# Patient Record
Sex: Male | Born: 1965 | Race: Black or African American | Hispanic: No | Marital: Married | Smoking: Never smoker
Health system: Southern US, Community
[De-identification: ages and names within clinical notes are randomized; demographics above are authoritative.]

## PROBLEM LIST (undated history)

## (undated) DIAGNOSIS — M1990 Unspecified osteoarthritis, unspecified site: Secondary | ICD-10-CM

## (undated) DIAGNOSIS — G4730 Sleep apnea, unspecified: Secondary | ICD-10-CM

## (undated) DIAGNOSIS — I10 Essential (primary) hypertension: Secondary | ICD-10-CM

## (undated) HISTORY — PX: TONSILLECTOMY: SUR1361

## (undated) HISTORY — PX: APPENDECTOMY: SHX54

## (undated) HISTORY — PX: HERNIA REPAIR: SHX51

## (undated) HISTORY — PX: CARPAL TUNNEL RELEASE: SHX101

---

## 2007-05-02 ENCOUNTER — Other Ambulatory Visit: Payer: Self-pay

## 2007-05-03 ENCOUNTER — Inpatient Hospital Stay: Payer: Self-pay | Admitting: Internal Medicine

## 2008-06-14 ENCOUNTER — Emergency Department: Payer: Self-pay | Admitting: Internal Medicine

## 2010-11-29 ENCOUNTER — Ambulatory Visit: Payer: Self-pay | Admitting: Internal Medicine

## 2011-02-06 ENCOUNTER — Ambulatory Visit: Payer: Self-pay | Admitting: Anesthesiology

## 2011-02-06 DIAGNOSIS — I10 Essential (primary) hypertension: Secondary | ICD-10-CM

## 2011-02-09 ENCOUNTER — Ambulatory Visit: Payer: Self-pay | Admitting: Unknown Physician Specialty

## 2011-11-07 ENCOUNTER — Ambulatory Visit: Payer: Self-pay | Admitting: Surgery

## 2011-11-07 LAB — CBC
HCT: 48.3 % (ref 40.0–52.0)
HGB: 16.2 g/dL (ref 13.0–18.0)
MCH: 26.9 pg (ref 26.0–34.0)
MCV: 80 fL (ref 80–100)
Platelet: 226 10*3/uL (ref 150–440)
RDW: 14.2 % (ref 11.5–14.5)
WBC: 9.7 10*3/uL (ref 3.8–10.6)

## 2011-11-07 LAB — URINALYSIS, COMPLETE
Bacteria: NONE SEEN
Bilirubin,UR: NEGATIVE
Blood: NEGATIVE
Glucose,UR: NEGATIVE mg/dL (ref 0–75)
Ketone: NEGATIVE
Ph: 5 (ref 4.5–8.0)
Protein: NEGATIVE
RBC,UR: NONE SEEN /HPF (ref 0–5)
Squamous Epithelial: NONE SEEN

## 2011-11-07 LAB — COMPREHENSIVE METABOLIC PANEL
Albumin: 3.9 g/dL (ref 3.4–5.0)
Anion Gap: 6 — ABNORMAL LOW (ref 7–16)
BUN: 11 mg/dL (ref 7–18)
Bilirubin,Total: 0.9 mg/dL (ref 0.2–1.0)
Calcium, Total: 9.7 mg/dL (ref 8.5–10.1)
Chloride: 100 mmol/L (ref 98–107)
Glucose: 112 mg/dL — ABNORMAL HIGH (ref 65–99)
Osmolality: 274 (ref 275–301)
Potassium: 4 mmol/L (ref 3.5–5.1)
SGOT(AST): 28 U/L (ref 15–37)
Sodium: 137 mmol/L (ref 136–145)
Total Protein: 8.3 g/dL — ABNORMAL HIGH (ref 6.4–8.2)

## 2011-11-14 LAB — PATHOLOGY REPORT

## 2012-10-10 ENCOUNTER — Ambulatory Visit: Payer: Self-pay | Admitting: Surgery

## 2013-09-22 IMAGING — CT CT ABD-PELV W/ CM
1 of 2 series · 15 of 32 positions shown, 19 images · non-contrast
Comparison: none

REASON FOR EXAM: (1) rlq pain; (2) rlq pain
COMMENTS:

[Series 2: soft tissue · axial · 0.87mm/px · z∈[-855,-378]mm · 15 of 173 slices shown, 19 images]
[im 7/173  soft-tissue]
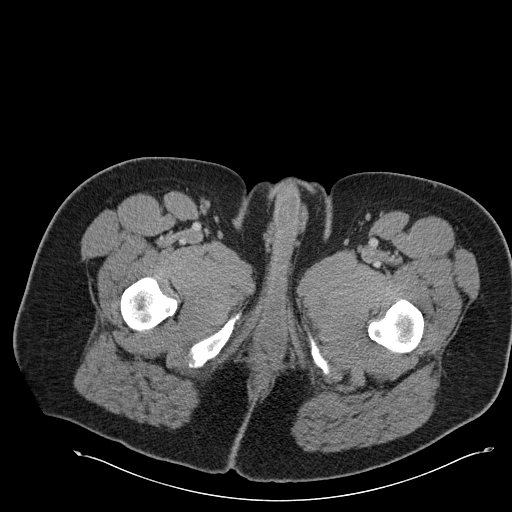
[im 7/173  bone]
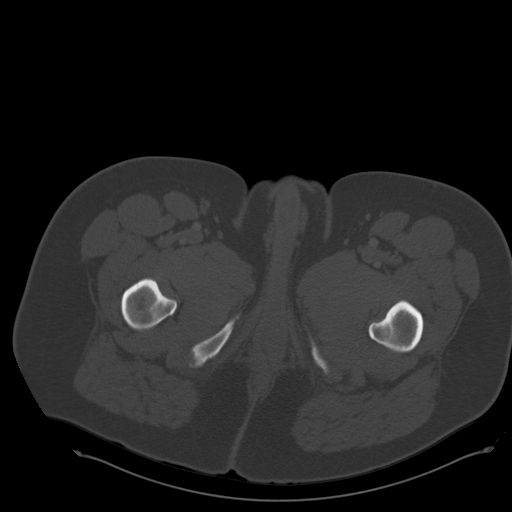
[im 21/173  soft-tissue]
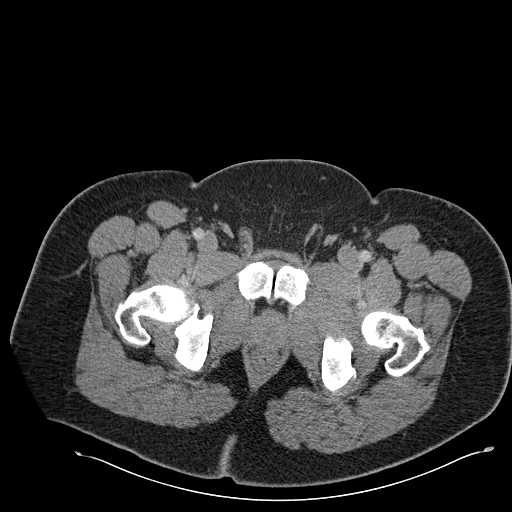
[im 35/173  soft-tissue]
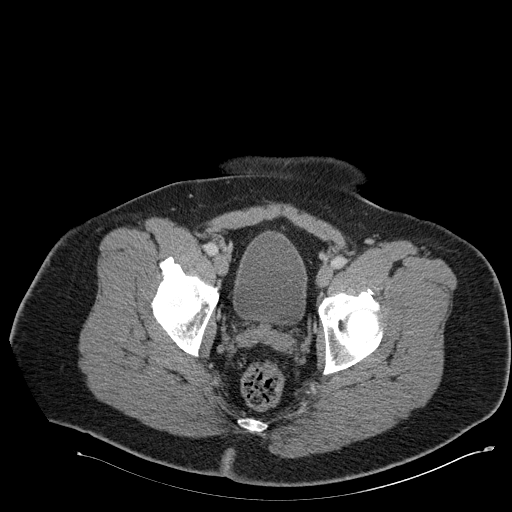
[im 49/173  soft-tissue]
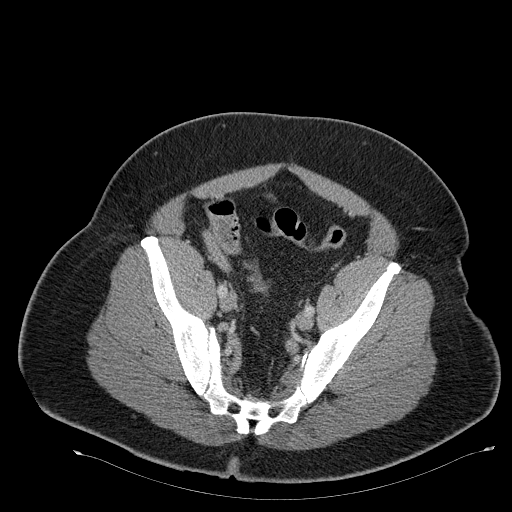
[im 62/173  soft-tissue]
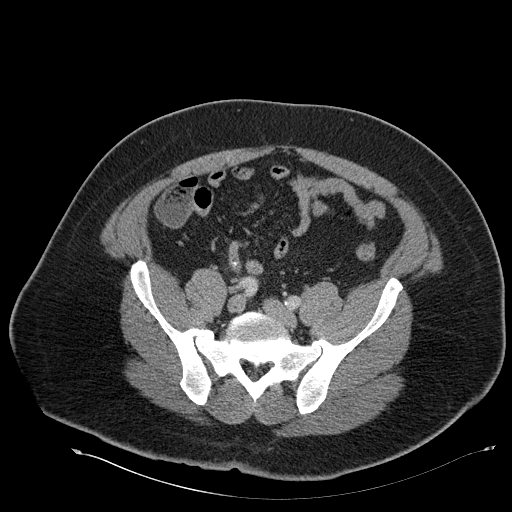
[im 76/173  soft-tissue]
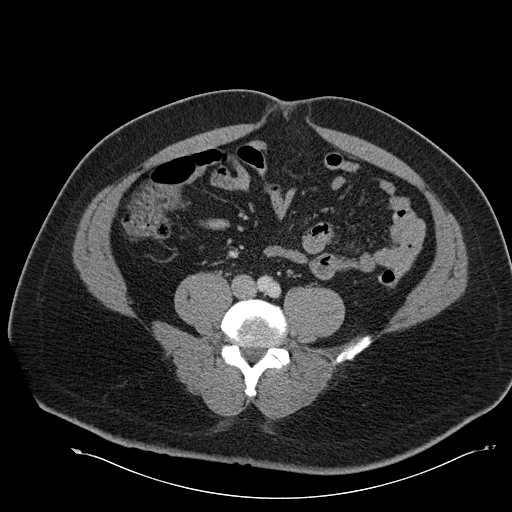
[im 90/173  soft-tissue]
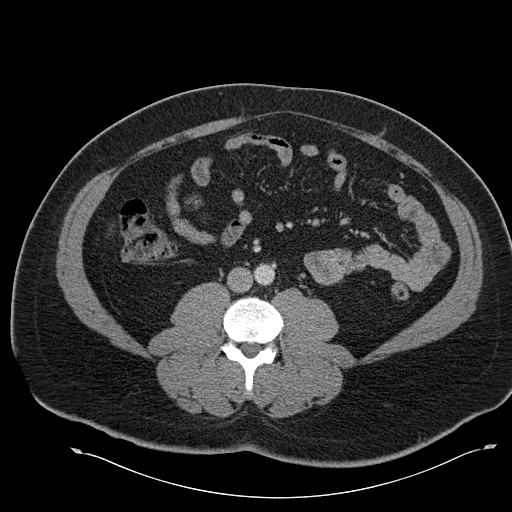
[im 97/173  soft-tissue]
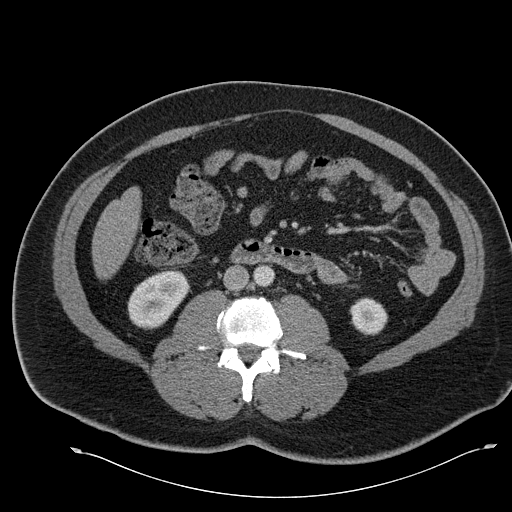
[im 111/173  soft-tissue]
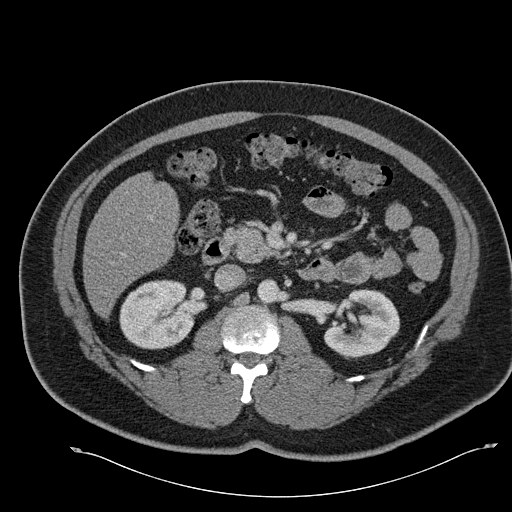
[im 111/173  bone]
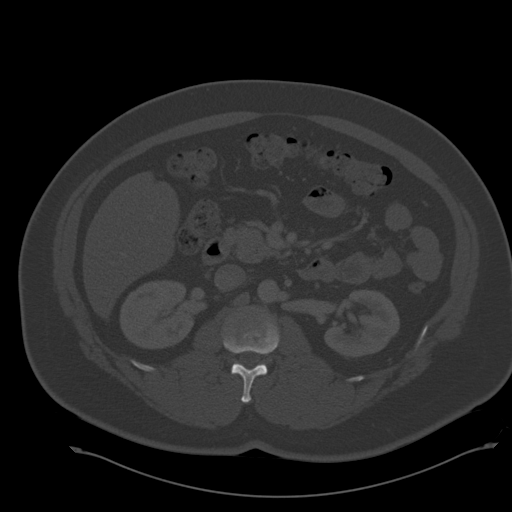
[im 124/173  soft-tissue]
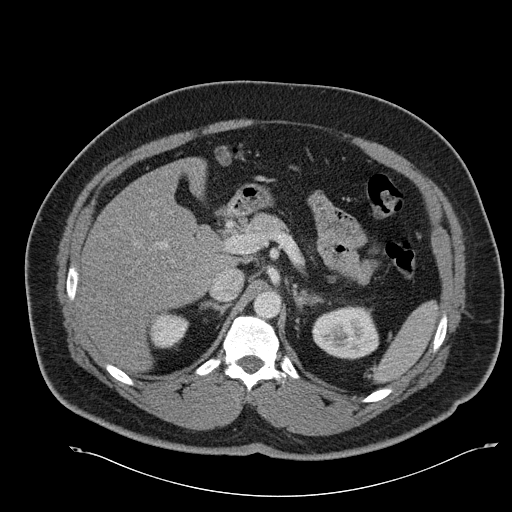
[im 138/173  soft-tissue]
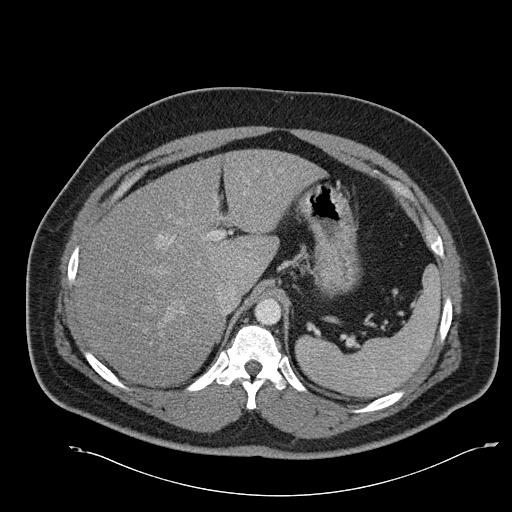
[im 145/173  lung]
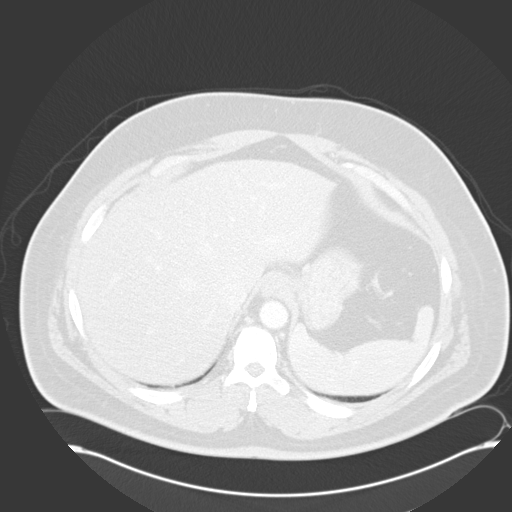
[im 152/173  soft-tissue]
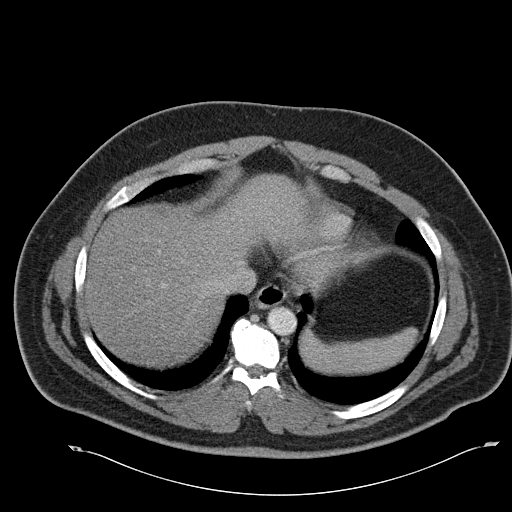
[im 152/173  lung]
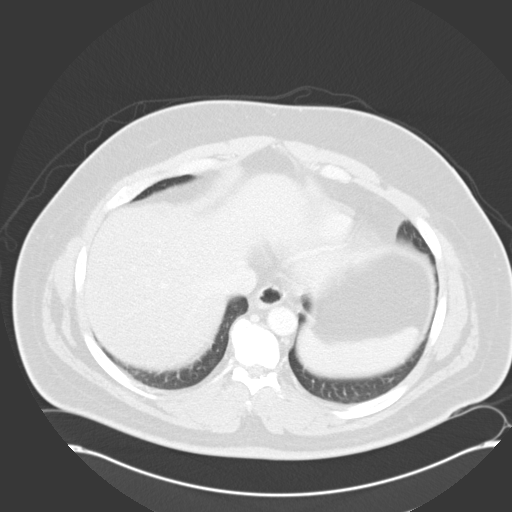
[im 159/173  lung]
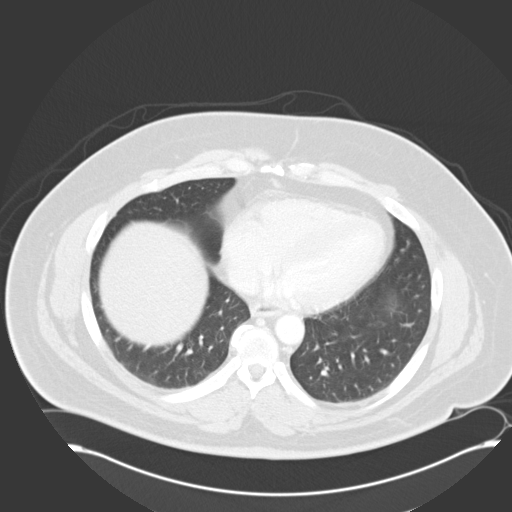
[im 166/173  soft-tissue]
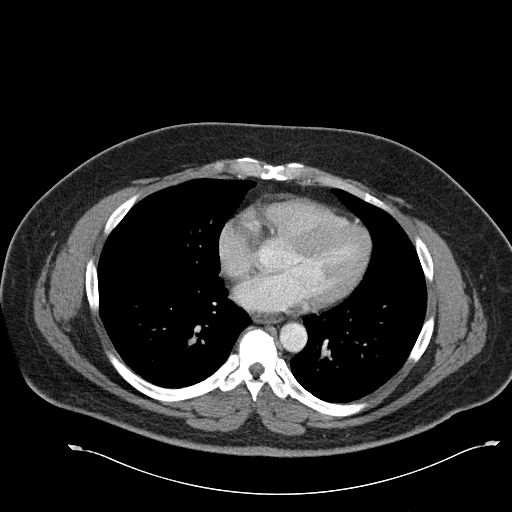
[im 166/173  lung]
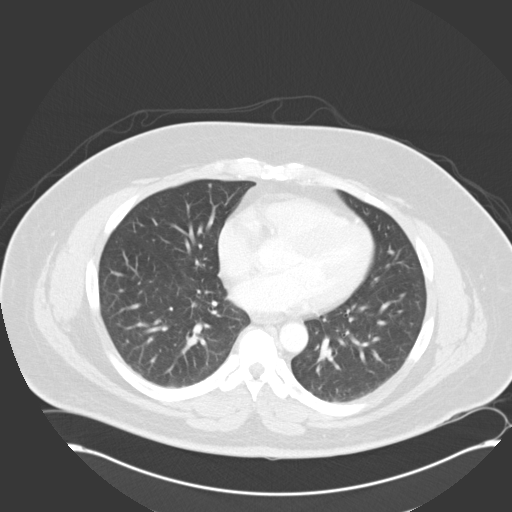

[15 of 32 positions shown; findings below may reference images not displayed]

PROCEDURE:     CT  - CT ABDOMEN / PELVIS  W  - November 07, 2011 [DATE]

RESULT:     CT of the abdomen and pelvis is performed with 100 mL of
6sovue-E2K iodinated intravenous contrast. No oral contrast was administered.

The appendix is identified and has diameter of 10.9 mm on image 117. There
is mild enhancement of the wall with irregular margins suggestive of mild
inflammation. Calcific densities seen at the base of the appendix at the
origin from the cecum on image 117 with an additional small area of
calcification on image 112 more towards the distal portion. There is no
evidence of perforation or abscess formation. Minimal sigmoid colon
diverticulosis is present very there is no evidence of acute diverticulitis.
There is no evidence of abnormal bowel distention otherwise. Fatty
infiltration is seen in the liver. Cholecystectomy clips are present. There
is a nonobstructing left renal stone on image 60 and image 59 measuring up
to 4 mm diameter. Tiny lower pole left renal stone of 1 to 2 mm is seen on
image 69. No additional calculi are seen. The adrenal glands appear
unremarkable on the right it showed minimal nodular density on the left on
image 41 there appears to be low-attenuation measuring 1.6 cm in diameter.
The liver shows no mass. The spleen is unremarkable. The pancreas is within
normal limits. No adenopathy is evident. The urinary bladder appears
unremarkable. The lung bases appear clear.
IMPRESSION: Mild inflammation consistent with acute appendicitis.
Appendicoliths appear present. Surgical consultation is recommended. There
is no evidence of perforation or abscess formation.
Left nephrolithiasis.

[REDACTED]

## 2014-06-30 NOTE — Op Note (Signed)
PATIENT NAME:  Lance, Willis MR#:  [id number] DATE OF BIRTH:  07/16/1965  DATE OF PROCEDURE:  11/07/2011  PREOPERATIVE DIAGNOSIS: Acute appendicitis.   POSTOPERATIVE DIAGNOSIS: Acute appendicitis.  PROCEDURE:  Laparoscopic appendectomy.   SURGEON:  Junius Creamer, MD  ANESTHESIA: General.   SPECIMEN: Appendix.   ESTIMATED BLOOD LOSS:  30 mL.  INDICATION FOR SURGERY: [name] is a 49 year old male who presents with 12 hours of right lower quadrant and suprapubic pain with loss of appetite.  He had a CT scan concerning for acute appendicitis. He is brought to the operating room for laparoscopic appendectomy.   DETAILS OF THE PROCEDURE:  The patient was brought to the operating room suite. He was laid supine on the operating room table. He was induced, endotracheal tube was placed, and general anesthesia was administered. The abdomen was then prepped and draped in standard surgical fashion. Time-out was then performed, correctly identifying patient name, operative site, and procedure to be performed. An infraumbilical incision was made. This was deepened into the fascia. The fascia was grasped. The fascia was incised.  I was able to place a finger but felt adhesions to the underside of the peritoneum. I decided to place a [facility] trocar. It was secured with 2-0 Vicryl transfascial stay sutures. The abdomen was insufflated. There was a large amount of adhesions superiorly at the patient's previous gallbladder incision and what looked like a hernia on CT scan. I put a 5-mm port in the right upper quadrant and then a left lower quadrant 5-mm port. I then proceeded to pull down these adhesions and examined my entry site and did not see any bowel injury. There was kind of a Swiss cheese appearing defect in the fascia superior the umbilical port. I then placed a left lower quadrant 5- mm trocar. I grasped the appendix. It was definitely acute appendicitis. I made a hole in the mesoappendix at the base of the  appendix. I placed a laparoscopic stapler across the base with a blue load and ligated the base of the appendix. The mesoappendix was extremely thick and therefore I placed two red load staples across the mesoappendix. The appendix was then taken with an Endo Catch bag. The staple line was examined and found to be hemostatic. The trocars were then taken out. The fascia was then closed with a figure-of-eight 0 Vicryl at the umbilical site. 4-0 deep dermal Monocryl was then used to close the skin. Steri-Strips were then used to close the incision. The patient was then awakened and taken to the postanesthesia care unit. There were no immediate complications. Needle, sponge, and instrument counts were correct at the end of the procedure.   ____________________________ Glena Norfolk. Ikea Demicco, MD cal:bjt D: 11/07/2011 17:43:05 ET T: 11/08/2011 10:08:08 ET JOB#: [id number]  cc: Harrell Gave A. December Hedtke, MD, <Dictator> Floyde Parkins MD ELECTRONICALLY SIGNED 11/10/2011 20:00

## 2014-06-30 NOTE — H&P (Signed)
Subjective/Chief Complaint Suprapubic and RLQ pain, loss of appetite    History of Present Illness Lance Willis is a pleasant 49 yo male who presents with approx 12 hours of suprapubic pain, loss of appetite and periappendiceal stranding on CT scan.  He says that he woke up this am approx 1 am with suprapubic pain worse on RLQ.  Says that he tried to go to work at the [employer] but pain made him come to the emergency room.  He says that his pain has gotten worse.  Has not eaten anything since yesterday due to decreased appetite.  Says he may be able to eat now but unsure.  No nausea/vomiting.  Pain medicine makes it better.  No fevers, chills, night sweats, shortness of breath, cough, diarrhea/constipation (has 1 bm per day).  No sick contacts.  No unusual ingestions.    Past History H/o cholecystectomy HTN OSA History of hand surgery    Past Medical Health Diabetes Mellitus   Past Med/Surgical Hx:  Bronchitis:   Sleep Apnea: uses cpap  chronic neutropenia:   Obesity:   high blood pressure.:   Tonsillectomy and Adenoidectomy:   Cholecystectomy: laparoscopic with umbilical hernia repair  ALLERGIES:  No Known Allergies:   Family and Social History:   Family History Non-Contributory    Social History negative tobacco, positive ETOH, negative Illicit drugs   Review of Systems:   Subjective/Chief Complaint RLQ, suprapubic pain    Fever/Chills No    Cough No    Sputum No    Abdominal Pain Yes    Diarrhea No    Constipation No    Nausea/Vomiting No    SOB/DOE No    Chest Pain No    Dysuria No    Tolerating Diet Yes    Medications/Allergies Reviewed Medications/Allergies reviewed   Physical Exam:   GEN well developed, well nourished, no acute distress, obese    HEENT pink conjunctivae, moist oral mucosa    NECK No masses    RESP normal resp effort  clear BS  no use of accessory muscles    CARD regular rate  no murmur    ABD positive tenderness  positive  hernia  normal BS    LYMPH negative neck, negative axillae    EXTR negative cyanosis/clubbing, negative edema    SKIN normal to palpation, No rashes, No ulcers    NEURO cranial nerves intact    PSYCH A+O to time, place, person   Radiology Results: CT:    27-Aug-13 11:13, CT Abdomen and Pelvis With Contrast   CT Abdomen and Pelvis With Contrast   REASON FOR EXAM:    (1) rlq pain; (2) rlq pain  COMMENTS:       PROCEDURE: CT  - CT ABDOMEN / PELVIS  W  - Nov 07 2011 11:13AM     RESULT: CT of the abdomen and pelvis is performed with 100 mL of   Isovue-370 iodinated intravenous contrast. No oral contrast was   administered.    The appendix is identified and has diameter of 10.9 mm on image 117.   There is mild enhancement of the wall with irregular margins suggestive   of mild inflammation. Calcific densities seen at the base of the appendix   at the origin from the cecum on image 117 with an additional small area   of calcification on image 112 more towards the distal portion. There is   no evidence of perforation or abscess formation. Minimal sigmoid colon  diverticulosis is present very there is no evidence of acute   diverticulitis. There is no evidence of abnormal bowel distention   otherwise. Fatty infiltration is seen in the liver. Cholecystectomy clips   are present. There is a nonobstructing left renal stone on image 60 and  image 59 measuring up to 4 mm diameter. Tiny lower pole left renal stone   of 1 to 2 mm is seen on image 69. No additional calculi are seen. The   adrenal glands appear unremarkable on the right it showed minimal nodular   density on the left on image 41 there appears to be low-attenuation   measuring 1.6 cm in diameter. The liver shows no mass. The spleen is   unremarkable. The pancreas is within normal limits. No adenopathy is   evident. The urinary bladder appears unremarkable. The lung bases appear   clear.    IMPRESSION:  Mild inflammation  consistent with acute appendicitis.   Appendicoliths appear present. Surgical consultation is recommended.     There is no evidence of perforation or abscess formation.  Left nephrolithiasis.    Dictation Site: 1          Verified By: Sundra Aland, M.D., MD     Assessment/Admission Diagnosis Lance Willis is a pleasant 49 yo M with history and CT findings consistent with acute appendicitis.  Plan for OR for laparoscopic appendectomy.  May consider VHR without mesh if able to do so without difficulty, otherwise may do electively with mesh in the future.   Electronic Signatures: Floyde Parkins (MD)  (Signed 27-Aug-13 14:24)  Authored: CHIEF COMPLAINT and HISTORY, PAST MEDICAL/SURGIAL HISTORY, ALLERGIES, FAMILY AND SOCIAL HISTORY, REVIEW OF SYSTEMS, PHYSICAL EXAM, Radiology, ASSESSMENT AND PLAN   Last Updated: 27-Aug-13 14:24 by Floyde Parkins (MD)

## 2014-07-03 ENCOUNTER — Ambulatory Visit
Admit: 2014-07-03 | Disposition: A | Discharge status: Home / Self Care | Payer: Self-pay | Attending: Unknown Physician Specialty | Admitting: Unknown Physician Specialty

## 2014-07-03 NOTE — Op Note (Signed)
PATIENT NAME:  Lance Willis, Lance Willis MR#:  [id number] DATE OF BIRTH:  Mar 28, 1965  DATE OF PROCEDURE:  10/10/2012  PREOPERATIVE DIAGNOSIS: Ventral hernia.   POSTOPERATIVE DIAGNOSIS: Ventral hernia.   PROCEDURE: Ventral hernia repair.   SURGEON: Rochel Brome.   ANESTHESIA: General.   INDICATIONS: This 49 year old male has a history of bulging in the epigastrium which is just approximately 3 cm above the navel and slightly oriented to the left of the midline. Ventral hernia was demonstrated on physical exam and repair was recommended for definitive treatment.   DESCRIPTION OF PROCEDURE: The patient was placed on the operating table in the supine position under general endotracheal anesthesia. The abdomen was clipped and prepared with ChloraPrep and draped in a sterile manner.   A transversely-oriented incision was made approximately 3 cm cephalad to the umbilicus, oriented slightly to the left of the midline approximately 4 cm in length, carried down through subcutaneous tissues to encounter a ventral hernia sac. This sac was approximately 3 cm in length and was dissected free from surrounding structures up into the fascial ring defect where it was separated from the fascial ring defect, and the sac was amputated. There was underlying omentum. No intestine was seen. The properitoneum was dissected so that the fatty tissue was dissected away from the fascia circumferentially underneath the fascial ring defect. The defect itself was approximately 12 mm in dimension. An Atrium mesh was selected and cut to create an oval shape of some 1 x 1.5 cm. This was placed into the properitoneal plane and was sutured to the fascia with 0 Surgilon through-and-through suture, placed inferiorly and superiorly.   Next, the repair was carried out with a transversely-oriented row of interrupted 0 Surgilon figure-of-eight sutures, incorporating each suture into the mesh. The repair looked good. It was noted that during the course  of the procedure a number of small bleeding points were cauterized. Hemostasis was subsequently intact. Subcutaneous tissues were approximated with interrupted 4-0 Monocryl. The skin was closed with running 4-0 Monocryl subcuticular suture and Dermabond.   The patient tolerated surgery satisfactorily and was then prepared for transfer to the recovery room.    ____________________________ Lenna Sciara. Rochel Brome, MD jws:dm D: 10/10/2012 08:36:41 ET T: 10/10/2012 09:09:13 ET JOB#: [id number]  cc: Loreli Dollar, MD, <Dictator> Loreli Dollar MD ELECTRONICALLY SIGNED 10/13/2012 17:26

## 2015-03-21 ENCOUNTER — Emergency Department
Admission: EM | Admit: 2015-03-21 | Discharge: 2015-03-21 | Disposition: A | Discharge status: Home / Self Care | Payer: Federal, State, Local not specified - PPO | Attending: Emergency Medicine | Admitting: Emergency Medicine

## 2015-03-21 ENCOUNTER — Emergency Department: Payer: Federal, State, Local not specified - PPO

## 2015-03-21 ENCOUNTER — Encounter: Payer: Self-pay | Admitting: Emergency Medicine

## 2015-03-21 DIAGNOSIS — I10 Essential (primary) hypertension: Secondary | ICD-10-CM | POA: Insufficient documentation

## 2015-03-21 DIAGNOSIS — J40 Bronchitis, not specified as acute or chronic: Secondary | ICD-10-CM

## 2015-03-21 DIAGNOSIS — H9202 Otalgia, left ear: Secondary | ICD-10-CM | POA: Diagnosis not present

## 2015-03-21 DIAGNOSIS — J209 Acute bronchitis, unspecified: Secondary | ICD-10-CM | POA: Diagnosis not present

## 2015-03-21 DIAGNOSIS — J069 Acute upper respiratory infection, unspecified: Secondary | ICD-10-CM

## 2015-03-21 DIAGNOSIS — R05 Cough: Secondary | ICD-10-CM | POA: Diagnosis present

## 2015-03-21 HISTORY — DX: Essential (primary) hypertension: I10

## 2015-03-21 MED ORDER — PROMETHAZINE HCL 25 MG PO TABS: 25.0000 mg | ORAL_TABLET | Freq: Four times a day (QID) | ORAL | Status: DC | PRN

## 2015-03-21 MED ORDER — PSEUDOEPHEDRINE HCL 60 MG PO TABS: 60.0000 mg | ORAL_TABLET | ORAL | Status: DC | PRN

## 2015-03-21 MED ORDER — AZITHROMYCIN 250 MG PO TABS: ORAL_TABLET | ORAL | Status: DC

## 2015-03-21 MED ORDER — HYDROCODONE-ACETAMINOPHEN 5-325 MG PO TABS: 1.0000 | ORAL_TABLET | ORAL | Status: DC | PRN

## 2015-03-21 MED ORDER — GUAIFENESIN-CODEINE 100-10 MG/5ML PO SOLN: 10.0000 mL | ORAL | Status: DC | PRN

## 2015-03-21 NOTE — ED Provider Notes (Signed)
[facility] Emergency Department Provider Note  ____________________________________________  Time seen: Approximately 1:15 PM  I have reviewed the triage vital signs and the nursing notes.   HISTORY  Chief Complaint Cough    HPI Lance Willis is a 50 y.o. male presents with a one-week history of cough congestion and ear pressure. Patient states that his cough is productive green sputum. Eyes any fever chills at this time.   Past Medical History  Diagnosis Date  . Hypertension     There are no active problems to display for this patient.   History reviewed. No pertinent past surgical history.  Current Outpatient Rx  Name  Route  Sig  Dispense  Refill  . azithromycin (ZITHROMAX Z-PAK) 250 MG tablet      Take 2 tablets (500 mg) on  Day 1,  followed by 1 tablet (250 mg) once daily on Days 2 through 5.   6 each   0   . guaiFENesin-codeine 100-10 MG/5ML syrup   Oral   Take 10 mLs by mouth every 4 (four) hours as needed for cough.   180 mL   0   . pseudoephedrine (SUDAFED) 60 MG tablet   Oral   Take 1 tablet (60 mg total) by mouth every 4 (four) hours as needed for congestion.   24 tablet   0     Allergies Review of patient's allergies indicates no known allergies.  History reviewed. No pertinent family history.  Social History Social History  Substance Use Topics  . Smoking status: Never Smoker   . Smokeless tobacco: None  . Alcohol Use: No    Review of Systems Constitutional: No fever/chills Eyes: No visual changes. ENT: Positive sore throat, left ear pain. Cardiovascular: Denies chest pain. Respiratory: Denies shortness of breath. Positive for cough. Gastrointestinal: No abdominal pain.  No nausea, no vomiting.  No diarrhea.  No constipation. Genitourinary: Negative for dysuria. Musculoskeletal: Negative for back pain. Skin: Negative for rash. Neurological: Negative for headaches, focal weakness or  numbness.  10-point ROS otherwise negative.  ____________________________________________   PHYSICAL EXAM:  VITAL SIGNS: ED Triage Vitals  Enc Vitals Group     BP 03/21/15 1235 135/87 mmHg     Pulse Rate 03/21/15 1235 115     Resp 03/21/15 1235 18     Temp 03/21/15 1235 97.7 F (36.5 C)     Temp src --      SpO2 03/21/15 1235 96 %     Weight 03/21/15 1235 280 lb (127.007 kg)     Height 03/21/15 1235 5\' 9"  (1.753 m)     Head Cir --      Peak Flow --      Pain Score 03/21/15 1236 3     Pain Loc --      Pain Edu? --      Excl. in [place]? --     Constitutional: Alert and oriented. Well appearing and in no acute distress. Eyes: Conjunctivae are normal. PERRL. EOMI. Head: Atraumatic. Left TM erythematous. Nose: No congestion/rhinnorhea. Mouth/Throat: Mucous membranes are moist.  Oropharynx non-erythematous. Neck: No stridor.   Cardiovascular: Normal rate, regular rhythm. Grossly normal heart sounds.  Good peripheral circulation. Respiratory: Normal respiratory effort.  No retractions. Lungs CTAB. Musculoskeletal: No lower extremity tenderness nor edema.  No joint effusions. Neurologic:  Normal speech and language. No gross focal neurologic deficits are appreciated. No gait instability. Skin:  Skin is warm, dry and intact. No rash noted. Psychiatric: Mood and affect  are normal. Speech and behavior are normal.  ____________________________________________   LABS (all labs ordered are listed, but only abnormal results are displayed)  Labs Reviewed - No data to display ____________________________________________    RADIOLOGY  Negative for pneumonia or acute atelectasis. No acute pulmonary findings ____________________________________________   PROCEDURES  Procedure(s) performed: None  Critical Care performed: No  ____________________________________________   INITIAL IMPRESSION / ASSESSMENT AND PLAN / ED COURSE  Pertinent labs & imaging results that were  available during my care of the patient were reviewed by me and considered in my medical decision making (see chart for details).  Acute URI/bronchitis. Rx given for Zithromax, Robitussin-AC, and Sudafed. Patient follow-up PCP or return to the ER with any worsening symptomology. Patient voices no other emergency medical complaints at this visit. ____________________________________________   FINAL CLINICAL IMPRESSION(S) / ED DIAGNOSES  Final diagnoses:  URI, acute  Bronchitis      Arlyss Repress, PA-C 03/21/15 1439  Eula Listen, MD 03/21/15 [phone]

## 2015-03-21 NOTE — Discharge Instructions (Signed)

## 2015-03-21 NOTE — ED Notes (Signed)
AAOx3.  Skin warm and dry.  NAD 

## 2015-03-21 NOTE — ED Notes (Signed)
Cough, congestion, ear pressure, nyquil at 430

## 2016-01-21 ENCOUNTER — Encounter (INDEPENDENT_AMBULATORY_CARE_PROVIDER_SITE_OTHER): Payer: Self-pay | Admitting: Orthopedic Surgery

## 2016-01-21 ENCOUNTER — Ambulatory Visit (INDEPENDENT_AMBULATORY_CARE_PROVIDER_SITE_OTHER): Payer: Federal, State, Local not specified - PPO | Admitting: Orthopedic Surgery

## 2016-01-21 VITALS — Ht 69.0 in | Wt 285.0 lb

## 2016-01-21 DIAGNOSIS — M65321 Trigger finger, right index finger: Secondary | ICD-10-CM | POA: Diagnosis not present

## 2016-01-21 DIAGNOSIS — M65331 Trigger finger, right middle finger: Secondary | ICD-10-CM | POA: Diagnosis not present

## 2016-01-21 NOTE — Progress Notes (Signed)
   Office Visit Note   Patient: Lance Willis           Date of Birth: [birth year]           MRN: [id number] Visit Date: 01/21/2016              Requested by: Madelyn Brunner, MD [facility] [place], [place] [address] PCP: Madelyn Brunner, MD   Assessment & Plan: Visit Diagnoses:  1. Trigger middle finger of right hand   2. Trigger finger, right index finger     Plan: Due to pain with activities of daily living and triggering worse in the morning and triggering at work patient elected to proceed with surgical intervention. Patient states that this was a work-related injury and we will request authorization to be filed through [employer].  Follow-Up Instructions: Return if symptoms worsen or fail to improve.   Orders:  No orders of the defined types were placed in this encounter.  No orders of the defined types were placed in this encounter.     Procedures: No procedures performed   Clinical Data: No additional findings.   Subjective: Chief Complaint  Patient presents with  . Right Hand - Numbness, Pain    Patient presents in office today for right hand pain. He has triggering of the right hand index and long finger.  He has had EMG/NCV studies a while back and was advised that he did not need carpal tunnel surgery but could possible need trigger finger release of A1 pulley. He complains of numbness right hand. He is right hand dominant. He has swelling in right hand and is taking ibuprofen every other day for pain. He has had prior relief with prednisone taper. He has brought duty status report paperwork to have filled out today at visit.     Review of Systems   Objective: Vital Signs: Ht 5\' 9"  (1.753 m)   Wt 285 lb (129.3 kg)   BMI 42.09 kg/m   Physical Exam Patient is alert oriented no adenopathy well-dressed normal affect normal respiratory effort normal gait. Examination patient has swelling and subjective numbness  in the right fingers. Patient's nerve conduction studies performed in December 2013 showed normal nerve conduction studies. Patient has palpable triggering at the A1 pulley of the index and long finger. Ortho Exam  Specialty Comments:  No specialty comments available.  Imaging: No results found.   PMFS History: There are no active problems to display for this patient.  Past Medical History:  Diagnosis Date  . Hypertension     History reviewed. No pertinent family history.  History reviewed. No pertinent surgical history. Social History   Occupational History  . Not on file.   Social History Main Topics  . Smoking status: Never Smoker  . Smokeless tobacco: Never Used  . Alcohol use No  . Drug use: Unknown  . Sexual activity: Not on file

## 2016-03-10 ENCOUNTER — Telehealth (INDEPENDENT_AMBULATORY_CARE_PROVIDER_SITE_OTHER): Payer: Self-pay | Admitting: Orthopedic Surgery

## 2016-03-10 NOTE — Telephone Encounter (Signed)
Lance Willis called in to discuss how long he would possible be out of work after his trigger finger release. He works for the [employer].  I discussed his case with Dondra Prader, NP and she advised to plan for out of work until patient's first post-operative appt.  I advised Lance Willis.

## 2016-07-12 ENCOUNTER — Other Ambulatory Visit: Payer: Self-pay | Admitting: Orthopedic Surgery

## 2016-07-12 DIAGNOSIS — M25562 Pain in left knee: Secondary | ICD-10-CM

## 2016-07-26 ENCOUNTER — Ambulatory Visit
Admission: RE | Admit: 2016-07-26 | Discharge: 2016-07-26 | Disposition: A | Discharge status: Home / Self Care | Payer: Federal, State, Local not specified - PPO | Source: Ambulatory Visit | Attending: Orthopedic Surgery | Admitting: Orthopedic Surgery

## 2016-07-26 DIAGNOSIS — M25562 Pain in left knee: Secondary | ICD-10-CM | POA: Diagnosis present

## 2016-07-26 DIAGNOSIS — X58XXXA Exposure to other specified factors, initial encounter: Secondary | ICD-10-CM | POA: Insufficient documentation

## 2016-07-26 DIAGNOSIS — M25462 Effusion, left knee: Secondary | ICD-10-CM | POA: Diagnosis not present

## 2016-07-26 DIAGNOSIS — S83242A Other tear of medial meniscus, current injury, left knee, initial encounter: Secondary | ICD-10-CM | POA: Insufficient documentation

## 2017-04-09 ENCOUNTER — Other Ambulatory Visit: Payer: Self-pay | Admitting: Orthopedic Surgery

## 2017-04-09 DIAGNOSIS — M25561 Pain in right knee: Secondary | ICD-10-CM

## 2017-04-24 ENCOUNTER — Ambulatory Visit
Admission: RE | Admit: 2017-04-24 | Discharge: 2017-04-24 | Disposition: A | Discharge status: Home / Self Care | Payer: Federal, State, Local not specified - PPO | Source: Ambulatory Visit | Attending: Orthopedic Surgery | Admitting: Orthopedic Surgery

## 2017-04-24 DIAGNOSIS — M23221 Derangement of posterior horn of medial meniscus due to old tear or injury, right knee: Secondary | ICD-10-CM | POA: Insufficient documentation

## 2017-04-24 DIAGNOSIS — M948X8 Other specified disorders of cartilage, other site: Secondary | ICD-10-CM | POA: Insufficient documentation

## 2017-04-24 DIAGNOSIS — M25561 Pain in right knee: Secondary | ICD-10-CM | POA: Diagnosis present

## 2017-06-21 ENCOUNTER — Encounter
Admission: RE | Admit: 2017-06-21 | Discharge: 2017-06-21 | Disposition: A | Discharge status: Home / Self Care | Payer: Federal, State, Local not specified - PPO | Source: Ambulatory Visit | Attending: Orthopedic Surgery | Admitting: Orthopedic Surgery

## 2017-06-21 ENCOUNTER — Other Ambulatory Visit: Payer: Self-pay

## 2017-06-21 DIAGNOSIS — I10 Essential (primary) hypertension: Secondary | ICD-10-CM | POA: Diagnosis not present

## 2017-06-21 DIAGNOSIS — Z01810 Encounter for preprocedural cardiovascular examination: Secondary | ICD-10-CM | POA: Insufficient documentation

## 2017-06-21 HISTORY — DX: Unspecified osteoarthritis, unspecified site: M19.90

## 2017-06-21 NOTE — Pre-Procedure Instructions (Signed)
AS INSTRUCTED BY DR Donata Duff, EKG FAXED WITH REQUEST FOR CLEARANCE TO DR Mable Fill. SPOKE WITH TRACY. ALSO FAXED FYI TO DR [facility]

## 2017-06-21 NOTE — Patient Instructions (Signed)
Your procedure is scheduled on: 06/28/17 Thurs Report to Same Day Surgery 2nd floor medical mall [facility] Entrance-take elevator on left to 2nd floor.  Check in with surgery information desk.) To find out your arrival time please call [phone] between 1PM - 3PM on 06/27/17 Wed  Remember: Instructions that are not followed completely may result in serious medical risk, up to and including death, or upon the discretion of your surgeon and anesthesiologist your surgery may need to be rescheduled.    _x___ 1. Do not eat food after midnight the night before your procedure. You may drink clear liquids up to 2 hours before you are scheduled to arrive at the hospital for your procedure.  Do not drink clear liquids within 2 hours of your scheduled arrival to the hospital.  Clear liquids include  --Water or Apple juice without pulp  --Clear carbohydrate beverage such as ClearFast or Gatorade  --Black Coffee or Clear Tea (No milk, no creamers, do not add anything to                  the coffee or Tea Type 1 and type 2 diabetics should only drink water.  No gum chewing or hard candies.     __x__ 2. No Alcohol for 24 hours before or after surgery.   __x__3. No Smoking or e-cigarettes for 24 prior to surgery.  Do not use any chewable tobacco products for at least 6 hour prior to surgery   ____  4. Bring all medications with you on the day of surgery if instructed.    __x__ 5. Notify your doctor if there is any change in your medical condition     (cold, fever, infections).    x___6. On the morning of surgery brush your teeth with toothpaste and water.  You may rinse your mouth with mouth wash if you wish.  Do not swallow any toothpaste or mouthwash.   Do not wear jewelry, make-up, hairpins, clips or nail polish.  Do not wear lotions, powders, or perfumes. You may wear deodorant.  Do not shave 48 hours prior to surgery. Men may shave face and neck.  Do not bring valuables to the hospital.     [facility] is not responsible for any belongings or valuables.               Contacts, dentures or bridgework may not be worn into surgery.  Leave your suitcase in the car. After surgery it may be brought to your room.  For patients admitted to the hospital, discharge time is determined by your                       treatment team.  _  Patients discharged the day of surgery will not be allowed to drive home.  You will need someone to drive you home and stay with you the night of your procedure.    Please read over the following fact sheets that you were given:   [facility] Preparing for Surgery and or MRSA Information   _x___ Take anti-hypertensive listed below, cardiac, seizure, asthma,     anti-reflux and psychiatric medicines. These include:  1. None  2.  3.  4.  5.  6.  ____Fleets enema or Magnesium Citrate as directed.   _x___ Use CHG Soap or sage wipes as directed on instruction sheet   ____ Use inhalers on the day of surgery and bring to hospital day of surgery  ____ Stop Metformin and Janumet 2 days prior to surgery.    ____ Take 1/2 of usual insulin dose the night before surgery and none on the morning     surgery.   _x___ Follow recommendations from Cardiologist, Pulmonologist or PCP regarding          stopping Aspirin, Coumadin, Plavix ,Eliquis, Effient, or Pradaxa, and Pletal.  X____Stop Anti-inflammatories such as Advil, Aleve, Ibuprofen, Motrin, Naproxen, Naprosyn, Goodies powders or aspirin products. OK to take Tylenol and                          Celebrex.   _x___ Stop supplements until after surgery.  But may continue Vitamin D, Vitamin B,       and multivitamin.   _x___ Bring C-Pap to the hospital.

## 2017-06-26 NOTE — Pre-Procedure Instructions (Signed)
CLEARED LOW RISK BY PCP ON CHART

## 2017-06-27 MED ORDER — CEFAZOLIN SODIUM-DEXTROSE 2-4 GM/100ML-% IV SOLN
2.0000 g | Freq: Once | INTRAVENOUS | Status: AC
  Administered 2017-06-28: 2 g via INTRAVENOUS
  Filled 2017-06-27: qty 100

## 2017-06-28 ENCOUNTER — Other Ambulatory Visit: Payer: Self-pay

## 2017-06-28 ENCOUNTER — Ambulatory Visit
Admission: RE | Admit: 2017-06-28 | Discharge: 2017-06-28 | Disposition: A | Discharge status: Home / Self Care | Payer: Federal, State, Local not specified - PPO | Source: Ambulatory Visit | Attending: Orthopedic Surgery | Admitting: Orthopedic Surgery

## 2017-06-28 ENCOUNTER — Ambulatory Visit: Payer: Federal, State, Local not specified - PPO | Admitting: Anesthesiology

## 2017-06-28 ENCOUNTER — Encounter
Admission: RE | Disposition: A | Discharge status: Home / Self Care | Payer: Self-pay | Source: Ambulatory Visit | Attending: Orthopedic Surgery

## 2017-06-28 ENCOUNTER — Encounter: Payer: Self-pay | Admitting: *Deleted

## 2017-06-28 DIAGNOSIS — Z8249 Family history of ischemic heart disease and other diseases of the circulatory system: Secondary | ICD-10-CM | POA: Diagnosis not present

## 2017-06-28 DIAGNOSIS — M23251 Derangement of posterior horn of lateral meniscus due to old tear or injury, right knee: Secondary | ICD-10-CM | POA: Insufficient documentation

## 2017-06-28 DIAGNOSIS — M2241 Chondromalacia patellae, right knee: Secondary | ICD-10-CM | POA: Insufficient documentation

## 2017-06-28 DIAGNOSIS — M23221 Derangement of posterior horn of medial meniscus due to old tear or injury, right knee: Secondary | ICD-10-CM | POA: Insufficient documentation

## 2017-06-28 DIAGNOSIS — I10 Essential (primary) hypertension: Secondary | ICD-10-CM | POA: Insufficient documentation

## 2017-06-28 DIAGNOSIS — Z79899 Other long term (current) drug therapy: Secondary | ICD-10-CM | POA: Diagnosis not present

## 2017-06-28 HISTORY — PX: KNEE ARTHROSCOPY WITH MEDIAL MENISECTOMY: SHX5651

## 2017-06-28 SURGERY — ARTHROSCOPY, KNEE, WITH MEDIAL MENISCECTOMY
Anesthesia: General | Site: Knee | Laterality: Right | Wound class: Clean

## 2017-06-28 MED ORDER — MIDAZOLAM HCL 2 MG/2ML IJ SOLN
INTRAMUSCULAR | Status: DC | PRN
  Administered 2017-06-28: 2 mg via INTRAVENOUS

## 2017-06-28 MED ORDER — PROPOFOL 10 MG/ML IV BOLUS
INTRAVENOUS | Status: DC | PRN
  Administered 2017-06-28: 50 mg via INTRAVENOUS
  Administered 2017-06-28: 200 mg via INTRAVENOUS

## 2017-06-28 MED ORDER — PROPOFOL 500 MG/50ML IV EMUL
INTRAVENOUS | Status: AC
  Filled 2017-06-28: qty 50

## 2017-06-28 MED ORDER — LIDOCAINE HCL (CARDIAC) PF 100 MG/5ML IV SOSY
PREFILLED_SYRINGE | INTRAVENOUS | Status: DC | PRN
  Administered 2017-06-28: 100 mg via INTRAVENOUS

## 2017-06-28 MED ORDER — ACETAMINOPHEN 10 MG/ML IV SOLN
INTRAVENOUS | Status: DC | PRN
  Administered 2017-06-28: 1000 mg via INTRAVENOUS

## 2017-06-28 MED ORDER — ONDANSETRON HCL 4 MG/2ML IJ SOLN: 4.0000 mg | Freq: Once | INTRAMUSCULAR | Status: DC | PRN

## 2017-06-28 MED ORDER — FENTANYL CITRATE (PF) 100 MCG/2ML IJ SOLN
INTRAMUSCULAR | Status: AC
  Filled 2017-06-28: qty 2

## 2017-06-28 MED ORDER — KETOROLAC TROMETHAMINE 30 MG/ML IJ SOLN
INTRAMUSCULAR | Status: DC | PRN
  Administered 2017-06-28: 30 mg via INTRAVENOUS

## 2017-06-28 MED ORDER — BUPIVACAINE HCL (PF) 0.5 % IJ SOLN
INTRAMUSCULAR | Status: AC
  Filled 2017-06-28: qty 10

## 2017-06-28 MED ORDER — FENTANYL CITRATE (PF) 100 MCG/2ML IJ SOLN
INTRAMUSCULAR | Status: DC | PRN
  Administered 2017-06-28: 100 ug via INTRAVENOUS

## 2017-06-28 MED ORDER — BUPIVACAINE-EPINEPHRINE (PF) 0.5% -1:200000 IJ SOLN
INTRAMUSCULAR | Status: DC | PRN
  Administered 2017-06-28: 30 mL

## 2017-06-28 MED ORDER — SEVOFLURANE IN SOLN
RESPIRATORY_TRACT | Status: AC
  Filled 2017-06-28: qty 250

## 2017-06-28 MED ORDER — HYDROCODONE-ACETAMINOPHEN 7.5-325 MG PO TABS: 1.0000 | ORAL_TABLET | ORAL | Status: DC | PRN

## 2017-06-28 MED ORDER — BUPIVACAINE-EPINEPHRINE (PF) 0.5% -1:200000 IJ SOLN
INTRAMUSCULAR | Status: AC
  Filled 2017-06-28: qty 30

## 2017-06-28 MED ORDER — CEFAZOLIN SODIUM-DEXTROSE 2-3 GM-%(50ML) IV SOLR
INTRAVENOUS | Status: AC
  Filled 2017-06-28: qty 50

## 2017-06-28 MED ORDER — FAMOTIDINE 20 MG PO TABS
20.0000 mg | ORAL_TABLET | Freq: Once | ORAL | Status: AC
  Administered 2017-06-28: 20 mg via ORAL

## 2017-06-28 MED ORDER — HYDROCODONE-ACETAMINOPHEN 5-325 MG PO TABS
1.0000 | ORAL_TABLET | ORAL | Status: DC | PRN
  Administered 2017-06-28: 1 via ORAL

## 2017-06-28 MED ORDER — FENTANYL CITRATE (PF) 100 MCG/2ML IJ SOLN
INTRAMUSCULAR | Status: AC
  Administered 2017-06-28: 25 ug via INTRAVENOUS
  Filled 2017-06-28: qty 2

## 2017-06-28 MED ORDER — FENTANYL CITRATE (PF) 100 MCG/2ML IJ SOLN
25.0000 ug | INTRAMUSCULAR | Status: DC | PRN
  Administered 2017-06-28 (×3): 25 ug via INTRAVENOUS

## 2017-06-28 MED ORDER — HYDROCODONE-ACETAMINOPHEN 5-325 MG PO TABS
ORAL_TABLET | ORAL | Status: AC
  Filled 2017-06-28: qty 1

## 2017-06-28 MED ORDER — MIDAZOLAM HCL 2 MG/2ML IJ SOLN
INTRAMUSCULAR | Status: AC
  Filled 2017-06-28: qty 2

## 2017-06-28 MED ORDER — LACTATED RINGERS IV SOLN
INTRAVENOUS | Status: DC
  Administered 2017-06-28 (×2): via INTRAVENOUS

## 2017-06-28 MED ORDER — ACETAMINOPHEN 10 MG/ML IV SOLN
INTRAVENOUS | Status: AC
  Filled 2017-06-28: qty 100

## 2017-06-28 MED ORDER — FAMOTIDINE 20 MG PO TABS
ORAL_TABLET | ORAL | Status: AC
  Filled 2017-06-28: qty 1

## 2017-06-28 MED ORDER — HYDROCODONE-ACETAMINOPHEN 5-325 MG PO TABS: 1.0000 | ORAL_TABLET | ORAL | 0 refills | Status: DC | PRN

## 2017-06-28 MED ORDER — ONDANSETRON HCL 4 MG/2ML IJ SOLN
INTRAMUSCULAR | Status: DC | PRN
  Administered 2017-06-28: 4 mg via INTRAVENOUS

## 2017-06-28 MED ORDER — DEXAMETHASONE SODIUM PHOSPHATE 10 MG/ML IJ SOLN
INTRAMUSCULAR | Status: DC | PRN
  Administered 2017-06-28: 10 mg via INTRAVENOUS

## 2017-06-28 MED ORDER — LIDOCAINE HCL (PF) 2 % IJ SOLN
INTRAMUSCULAR | Status: AC
  Filled 2017-06-28: qty 10

## 2017-06-28 SURGICAL SUPPLY — 25 items
BANDAGE ACE 4X5 VEL STRL LF (GAUZE/BANDAGES/DRESSINGS) IMPLANT
BLADE INCISOR PLUS 4.5 (BLADE) IMPLANT
CHLORAPREP W/TINT 26ML (MISCELLANEOUS) ×3 IMPLANT
CUFF TOURN 24 STER (MISCELLANEOUS) IMPLANT
CUFF TOURN 30 STER DUAL PORT (MISCELLANEOUS) IMPLANT
GAUZE SPONGE 4X4 12PLY STRL (GAUZE/BANDAGES/DRESSINGS) ×3 IMPLANT
GLOVE SURG SYN 9.0  PF PI (GLOVE) ×2
GLOVE SURG SYN 9.0 PF PI (GLOVE) ×1 IMPLANT
GOWN SRG 2XL LVL 4 RGLN SLV (GOWNS) ×1 IMPLANT
GOWN STRL NON-REIN 2XL LVL4 (GOWNS) ×2
GOWN STRL REUS W/ TWL LRG LVL3 (GOWN DISPOSABLE) ×2 IMPLANT
GOWN STRL REUS W/TWL LRG LVL3 (GOWN DISPOSABLE) ×4
IV LACTATED RINGER IRRG 3000ML (IV SOLUTION) ×4
IV LR IRRIG 3000ML ARTHROMATIC (IV SOLUTION) ×2 IMPLANT
KIT TURNOVER KIT A (KITS) ×3 IMPLANT
MANIFOLD NEPTUNE II (INSTRUMENTS) ×3 IMPLANT
PACK ARTHROSCOPY KNEE (MISCELLANEOUS) ×3 IMPLANT
SCALPEL PROTECTED #11 DISP (BLADE) ×3 IMPLANT
SET TUBE SUCT SHAVER OUTFL 24K (TUBING) ×3 IMPLANT
SET TUBE TIP INTRA-ARTICULAR (MISCELLANEOUS) ×3 IMPLANT
SUT ETHILON 4-0 (SUTURE) ×2
SUT ETHILON 4-0 FS2 18XMFL BLK (SUTURE) ×1
SUTURE ETHLN 4-0 FS2 18XMF BLK (SUTURE) ×1 IMPLANT
TUBING ARTHRO INFLOW-ONLY STRL (TUBING) ×3 IMPLANT
WAND COBLATION FLOW 50 (SURGICAL WAND) ×3 IMPLANT

## 2017-06-28 NOTE — Transfer of Care (Signed)
Immediate Anesthesia Transfer of Care Note  Patient: Lance Willis  Procedure(s) Performed: KNEE ARTHROSCOPY WITH PARTIAL MEDIAL MENISECTOMY (Right Knee)  Patient Location: PACU  Anesthesia Type:General  Level of Consciousness: drowsy  Airway & Oxygen Therapy: Patient Spontanous Breathing  Post-op Assessment: Report given to RN  Post vital signs: stable  Last Vitals:  Vitals Value Taken Time  BP 132/87 06/28/2017 10:32 AM  Temp    Pulse 81 06/28/2017 10:32 AM  Resp 0 06/28/2017 10:32 AM  SpO2 98 % 06/28/2017 10:32 AM  Vitals shown include unvalidated device data.  Last Pain:  Vitals:   06/28/17 0834  TempSrc: Tympanic  PainSc: 0-No pain         Complications: No apparent anesthesia complications

## 2017-06-28 NOTE — H&P (Signed)
Reviewed paper H+P, will be scanned into chart. No changes noted.  

## 2017-06-28 NOTE — Anesthesia Preprocedure Evaluation (Signed)
Anesthesia Evaluation  Patient identified by MRN, date of birth, ID band Patient awake    Reviewed: Allergy & Precautions, H&P , NPO status , Patient's Chart, lab work & pertinent test results, reviewed documented beta blocker date and time   Airway Mallampati: II  TM Distance: >3 FB Neck ROM: full    Dental  (+) Teeth Intact   Pulmonary neg pulmonary ROS, sleep apnea ,    Pulmonary exam normal        Cardiovascular Exercise Tolerance: Good hypertension, On Medications negative cardio ROS Normal cardiovascular exam Rate:Normal     Neuro/Psych negative neurological ROS  negative psych ROS   GI/Hepatic negative GI ROS, Neg liver ROS,   Endo/Other  negative endocrine ROS  Renal/GU negative Renal ROS  negative genitourinary   Musculoskeletal   Abdominal   Peds  Hematology negative hematology ROS (+)   Anesthesia Other Findings   Reproductive/Obstetrics negative OB ROS                             Anesthesia Physical Anesthesia Plan  ASA: III  Anesthesia Plan: General LMA   Post-op Pain Management:    Induction:   PONV Risk Score and Plan: 3  Airway Management Planned:   Additional Equipment:   Intra-op Plan:   Post-operative Plan:   Informed Consent: I have reviewed the patients History and Physical, chart, labs and discussed the procedure including the risks, benefits and alternatives for the proposed anesthesia with the patient or authorized representative who has indicated his/her understanding and acceptance.     Plan Discussed with: CRNA  Anesthesia Plan Comments:         Anesthesia Quick Evaluation

## 2017-06-28 NOTE — Anesthesia Postprocedure Evaluation (Signed)
Anesthesia Post Note  Patient: Joanna Hall Zinn  Procedure(s) Performed: KNEE ARTHROSCOPY WITH PARTIAL MEDIAL MENISECTOMY (Right Knee)  Patient location during evaluation: PACU Anesthesia Type: General Level of consciousness: awake and alert Pain management: pain level controlled Vital Signs Assessment: post-procedure vital signs reviewed and stable Respiratory status: spontaneous breathing, nonlabored ventilation, respiratory function stable and patient connected to nasal cannula oxygen Cardiovascular status: blood pressure returned to baseline and stable Postop Assessment: no apparent nausea or vomiting Anesthetic complications: no     Last Vitals:  Vitals:   06/28/17 1141 06/28/17 1202  BP: (!) 143/94 (!) 143/89  Pulse: 69 62  Resp: 16 18  Temp: (!) 36.2 C 36.5 C  SpO2: 98% 98%    Last Pain:  Vitals:   06/28/17 1202  TempSrc: Temporal  PainSc: 0-No pain                 Molli Barrows

## 2017-06-28 NOTE — Discharge Instructions (Addendum)
Weightbearing as tolerated on right leg.  Can put ice to the right knee today and tomorrow for pain relief.  Take pain medicine as directed.  Aspirin 325 mg daily until walking normally.  Keep dressing clean and dry   AMBULATORY SURGERY  DISCHARGE INSTRUCTIONS   1) The drugs that you were given will stay in your system until tomorrow so for the next 24 hours you should not:  A) Drive an automobile B) Make any legal decisions C) Drink any alcoholic beverage   2) You may resume regular meals tomorrow.  Today it is better to start with liquids and gradually work up to solid foods.  You may eat anything you prefer, but it is better to start with liquids, then soup and crackers, and gradually work up to solid foods.   3) Please notify your doctor immediately if you have any unusual bleeding, trouble breathing, redness and pain at the surgery site, drainage, fever, or pain not relieved by medication.    4) Additional Instructions:        Please contact your physician with any problems or Same Day Surgery at [phone], Monday through Friday 6 am to 4 pm, or [place] at [facility] number at [phone].

## 2017-06-28 NOTE — Anesthesia Post-op Follow-up Note (Signed)
Anesthesia QCDR form completed.        

## 2017-06-28 NOTE — Op Note (Signed)
06/28/2017  10:25 AM  PATIENT:  Lance Willis  52 y.o. male  PRE-OPERATIVE DIAGNOSIS:  DERANGEMENT OF POSTERIOR HORN OF MEDIAL MENISCUS OF RIGHT KNEE  POST-OPERATIVE DIAGNOSIS:  DERANGEMENT OF POSTERIOR HORN OF MEDIAL MENISCUS OF RIGHT KNEE  PROCEDURE:  Procedure(s): KNEE ARTHROSCOPY WITH PARTIAL MEDIAL MENISECTOMY (Right)  SURGEON: Laurene Footman, MD  ASSISTANTS: None  ANESTHESIA:   general  EBL:  No intake/output data recorded.  BLOOD ADMINISTERED:none  DRAINS: none   LOCAL MEDICATIONS USED:  MARCAINE     SPECIMEN:  No Specimen  DISPOSITION OF SPECIMEN:  N/A  COUNTS:  YES  TOURNIQUET:  * Missing tourniquet times found for documented tourniquets in log: [id number] *no tourniquet inflated during the procedure  IMPLANTS: None  DICTATION: .Dragon Dictation patient was brought to the operating room and after adequate general anesthesia was obtained the right leg was prepped and draped in the sterile fashion after applying a tourniquet and leg holder.  After appropriate patient identification and timeout procedures were completed inferior lateral portal was made and the arthroscope introduced.  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  Coming on the medial and lateral compartments and gutters there were no loose bodies.  Going to the medial compartment inferior medial portal was made and a probe introduced there was a bucket-handle tear of the posterior horn of the meniscus that it split in its midline with so there were flap tears of the posterior medial and posterior lateral aspects of the posterior medial meniscus this was debrided with use of meniscal punch and ArthroCare wand to do a stable margin resecting approximately 80% of the posterior medial meniscus.  There also was significant degenerative change to the medial tibial and  femoral condyles with near full-thickness loss in about a centimeter area on the femoral condyle with joining lesion on the tibia involving proximally half the thickness with some fissuring.  The ACL was intact and the lateral compartment was normal after just the medial meniscus the knee was irrigated until clear and almost mentation withdrawn.  The wounds were infiltrated with 10 cc half percent Sensorcaine with epinephrine each followed by Xeroform 4 x 4's web roll and Ace wrap  PLAN OF CARE: Discharge to home after PACU  PATIENT DISPOSITION:  PACU - hemodynamically stable.

## 2017-06-29 ENCOUNTER — Encounter: Payer: Self-pay | Admitting: Orthopedic Surgery

## 2018-06-11 IMAGING — MR MR KNEE*L* W/O CM
6 series · 39 of 40 positions shown · non-contrast
Comparison: None.

CLINICAL DATA: Medial knee pain.

EXAM:
MRI OF THE LEFT KNEE WITHOUT CONTRAST
TECHNIQUE: Multiplanar, multisequence MR imaging of the knee was performed. No
intravenous contrast was administered.

[Series 3: PD fat-sat · axial · 3.0mm · 0.35mm/px · z∈[-70,+42]mm · 8 of 35 slices shown (1 of 4)]
[im 1/35]
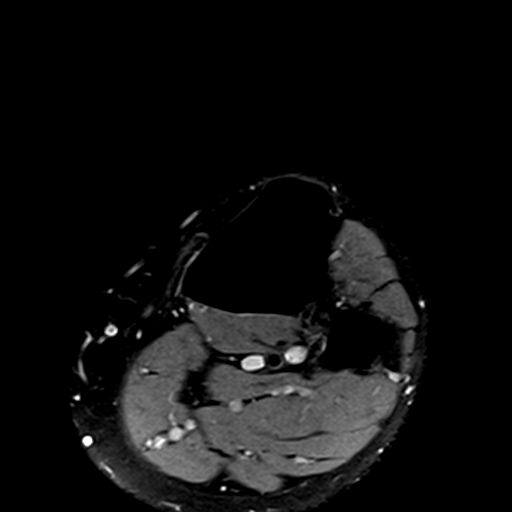
[im 5/35]
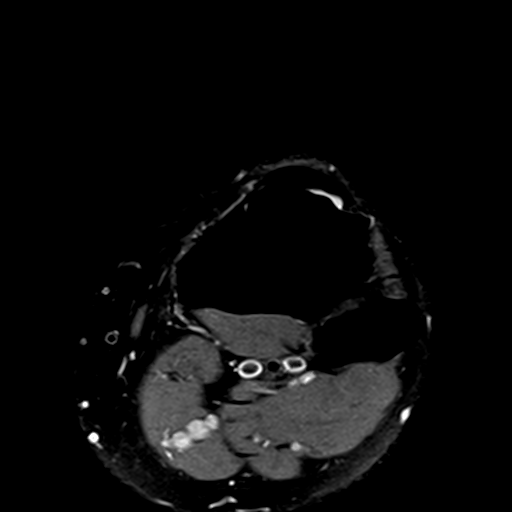
[im 10/35]
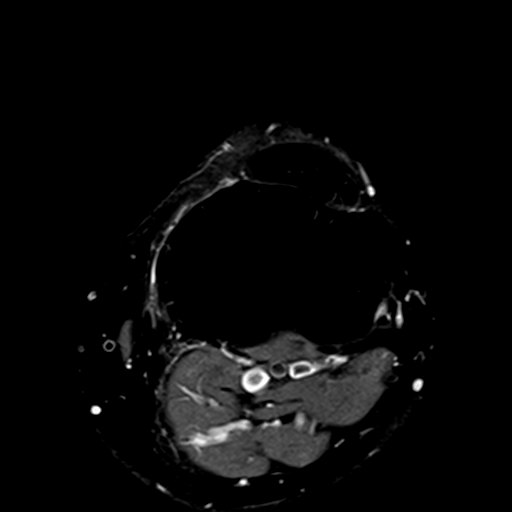
[im 15/35]
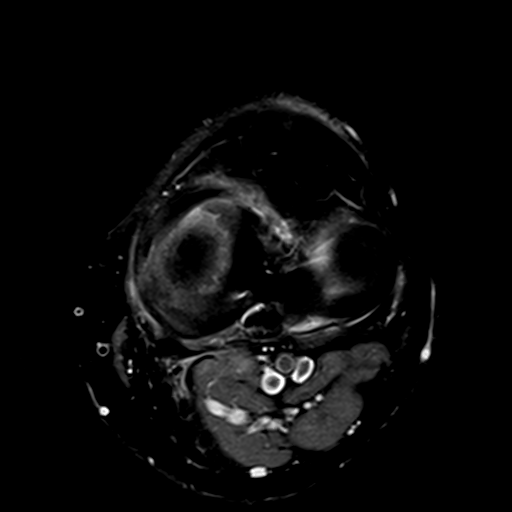
[im 20/35]
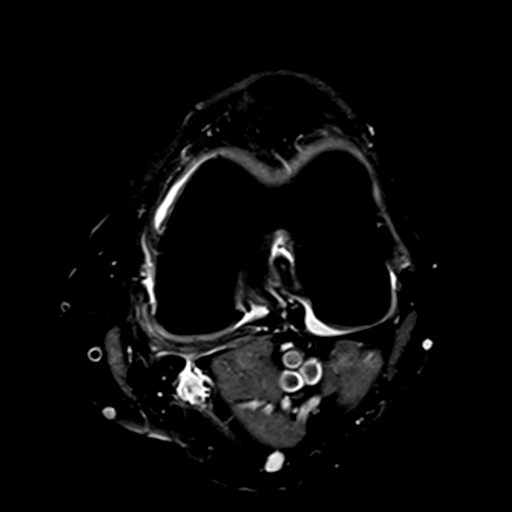
[im 25/35]
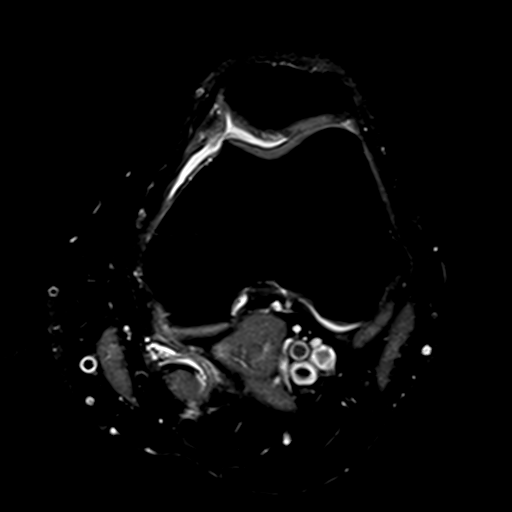
[im 30/35]
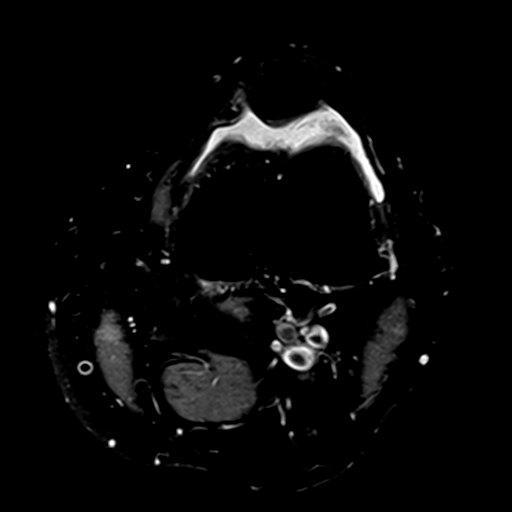
[im 35/35]
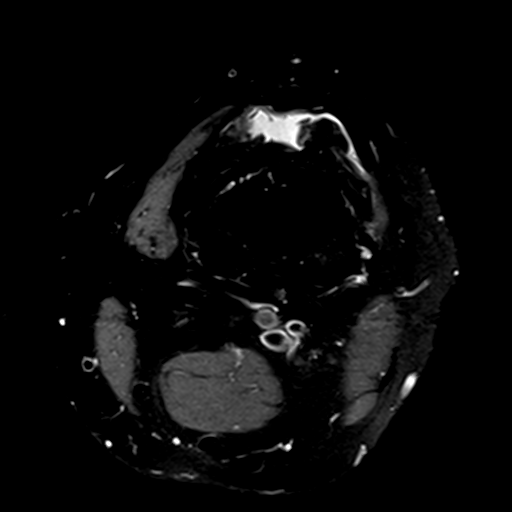

[Series 4: T1 · coronal · 3.0mm · 0.50mm/px · 7 of 36 slices shown]
[im 1/36]
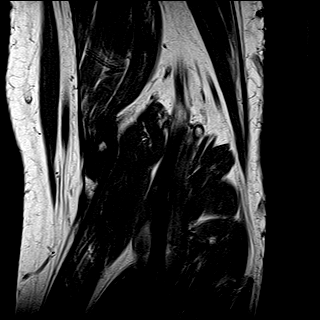
[im 6/36]
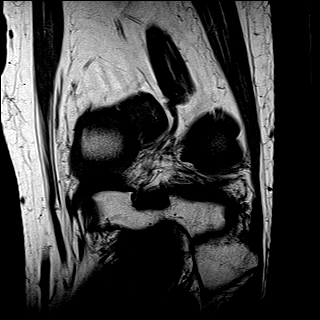
[im 11/36]
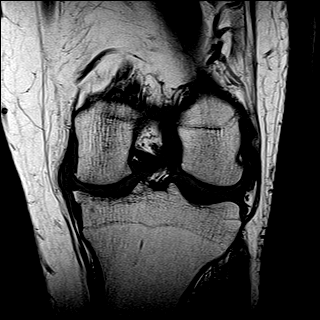
[im 16/36]
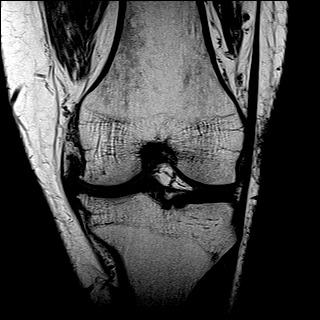
[im 21/36]
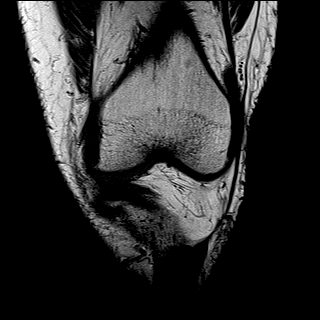
[im 26/36]
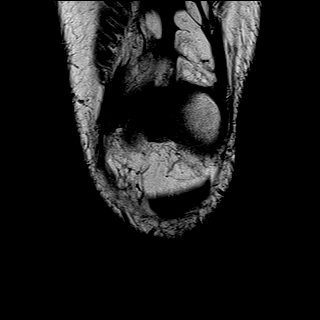
[im 31/36]
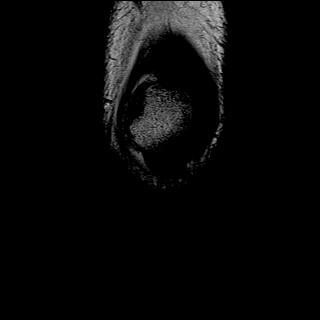

[Series 5: T2 fat-sat · coronal · 3.0mm · 0.50mm/px · 7 of 36 slices shown]
[im 1/36]
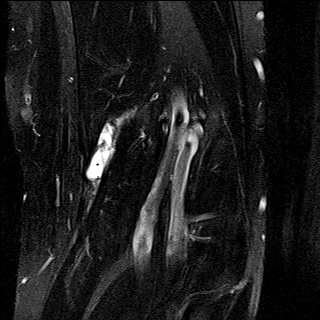
[im 6/36]
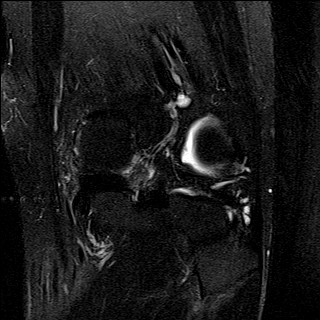
[im 12/36]
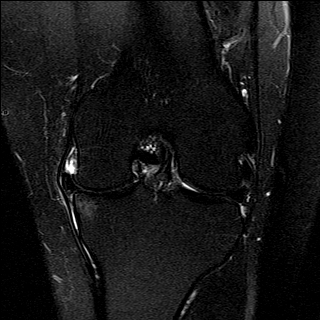
[im 18/36]
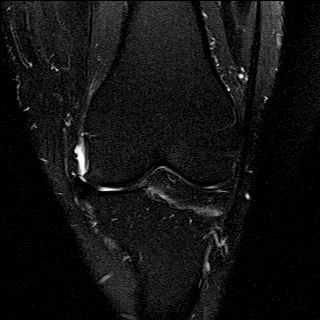
[im 24/36]
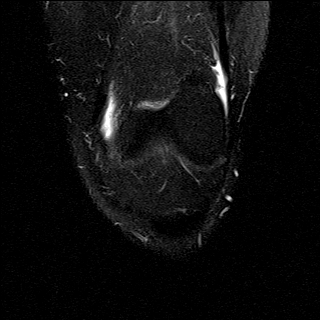
[im 30/36]
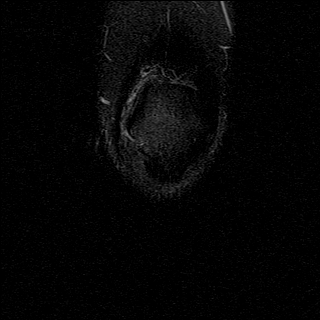
[im 36/36]
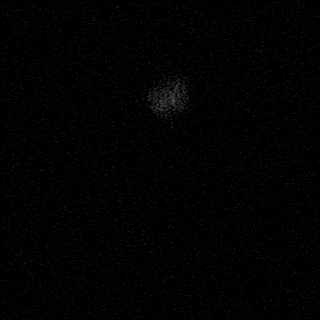

[Series 6: PD fat-sat · coronal · 3.0mm · 0.62mm/px · 7 of 36 slices shown (2 of 4)]
[im 1/36]
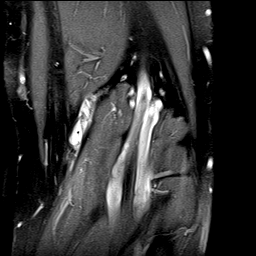
[im 6/36]
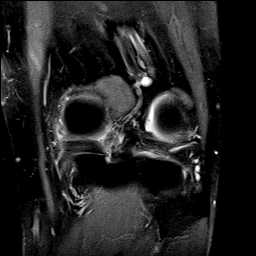
[im 12/36]
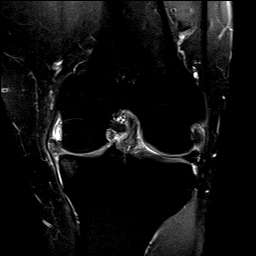
[im 18/36]
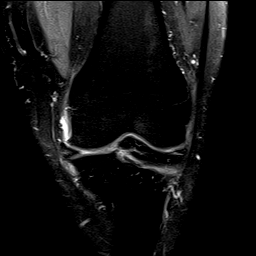
[im 24/36]
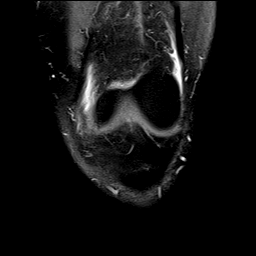
[im 30/36]
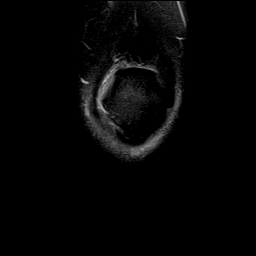
[im 36/36]
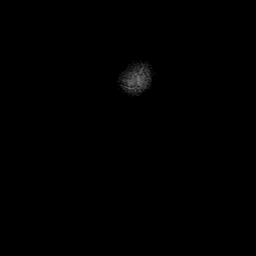

[Series 7: PD fat-sat · sagittal · 3.0mm · 0.62mm/px · 7 of 35 slices shown (3 of 4)]
[im 1/35]
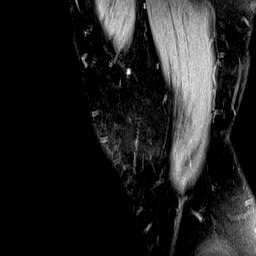
[im 6/35]
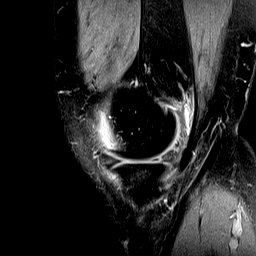
[im 12/35]
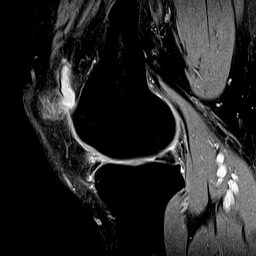
[im 18/35]
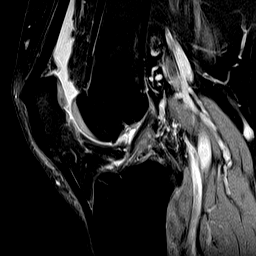
[im 23/35]
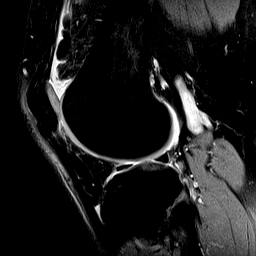
[im 29/35]
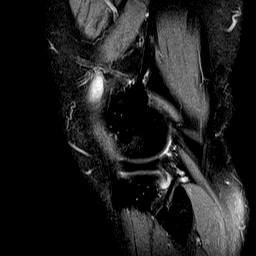
[im 35/35]
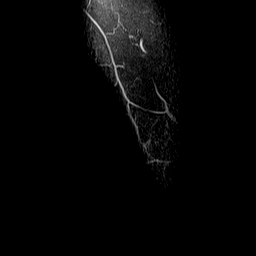

[Series 8: PD fat-sat · coronal · 2.0mm · 0.62mm/px · 3 of 16 slices shown (4 of 4)]
[im 1/16]
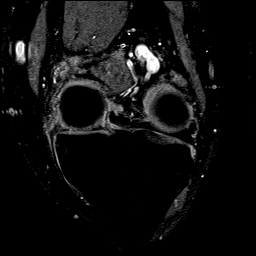
[im 8/16]
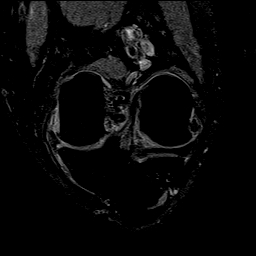
[im 16/16]
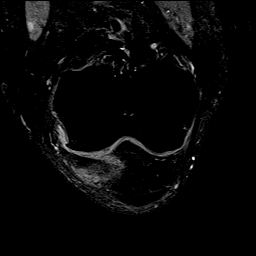

[39 of 40 positions shown; findings below may reference images not displayed]

FINDINGS: MENISCI

Medial meniscus: Complex tear of the posterior horn- body junction
of the medial meniscus.

Lateral meniscus:  Intact.

LIGAMENTS

Cruciates:  Intact ACL and PCL.

Collaterals: Medial collateral ligament is intact. Lateral
collateral ligament complex is intact.

CARTILAGE

Patellofemoral: Partial-thickness cartilage loss of the patellar
apex. Partial-thickness cartilage loss of the medial patellar facet.

Medial: High-grade partial-thickness cartilage loss of the medial
femorotibial compartment with subchondral reactive marrow edema at
the periphery of the medial tibial plateau.

Lateral:  No chondral defect.

Joint: Moderate joint effusion. Normal Hoffa's fat. No plical
thickening.

Popliteal Fossa:  No Baker cyst. Intact popliteus tendon.

Extensor Mechanism: Intact quadriceps tendon. Mild tendinosis of the
proximal patellar tendon without a tear. Intact medial and lateral
patellar retinaculum. Intact MPFL.

Bones: No other marrow signal abnormality. No fracture dislocation.

Other: No fluid collection or hematoma.
IMPRESSION: 1. Complex tear of the posterior horn- body junction of the medial
meniscus.
2. Cartilage abnormalities of the patellofemoral compartment and
medial femorotibial compartment as described above.
3. Moderate joint effusion.
4. Mild tendinosis of the proximal patellar tendon.

## 2019-04-23 ENCOUNTER — Ambulatory Visit
Admission: RE | Admit: 2019-04-23 | Payer: Federal, State, Local not specified - PPO | Source: Home / Self Care | Admitting: Surgery

## 2019-04-23 ENCOUNTER — Ambulatory Visit: Payer: Self-pay | Admitting: Surgery

## 2019-04-23 ENCOUNTER — Encounter: Admission: RE | Payer: Self-pay | Source: Home / Self Care

## 2019-04-23 SURGERY — EXCISION, LESION, NECK
Anesthesia: General | Site: Neck

## 2019-04-23 NOTE — H&P (Signed)
Subjective:   CC: Localized swelling, mass and lump, neck [R22.1]  HPI:  Lance Willis is a 54 y.o. male who was referred by Donnamarie Rossetti, * for evaluation of above. First noted 6 months ago.   asymptomatic, growing in size. Similar to cyst removed before.  Past Medical History:  has a past medical history of Hypertension, Osteoarthritis, and Sleep apnea.  Past Surgical History:  has a past surgical history that includes Appendectomy; Cholecystectomy; Tonsillectomy; Hernia repair; Endoscopic Carpal Tunnel Release (Left, 02/09/11); Colonoscopy (07/03/2014); and Knee arthroscopy with partial medial menisectomy right (Right, 06/28/2017).  Family History: family history includes High blood pressure (Hypertension) in his father, mother, sister, and sister; Lymphoma in his cousin.  Social History:  reports that he has never smoked. He has never used smokeless tobacco. He reports previous alcohol use. He reports that he does not use drugs.  Current Medications: has a current medication list which includes the following prescription(s): doxazosin, losartan-hydrochlorothiazide, and turmeric-turmeric root extract.  Allergies:  No Known Allergies  ROS:  A 15 point review of systems was performed and pertinent positives and negatives noted in HPI   Objective:   BP 145/87   Pulse 91   Ht 175.3 cm (5\' 9" )   Wt (!) 126.6 kg (279 lb)   BMI 41.20 kg/m   Constitutional :  alert, appears stated age, cooperative and no distress  Lymphatics/Throat:  no asymmetry, masses, or scars  Respiratory:  clear to auscultation bilaterally  Cardiovascular:  regular rate and rhythm  Gastrointestinal: soft, non-tender; bowel sounds normal; no masses,  no organomegaly.    Musculoskeletal: Steady gait and movement  Skin: Cool and moist.  Soft, smooth, 1.5cm mobile mass under skin around left occipital base, possible liopma, no other lumps or bumps in area.  Psychiatric: Normal affect,  non-agitated, not confused       LABS:  n/a   RADS: n/a  Assessment:      Localized swelling, mass and lump, neck [R22.1]  Plan:   1. Localized swelling, mass and lump, neck [R22.1] Discussed surgical excision.  Alternatives include continued observation.  Benefits include possible symptom relief, pathologic evaluation, improved cosmesis. Discussed the risk of surgery including recurrence, chronic pain, post-op infxn, poor cosmesis, poor/delayed wound healing, and possible re-operation to address said risks. The risks of general anesthetic, if used, includes MI, CVA, sudden death or even reaction to anesthetic medications also discussed.  Typical post-op recovery time of 3-5 days with possible activity restrictions were also discussed.  The patient verbalized understanding and all questions were answered to the patient's satisfaction.  2. Patient has elected to proceed with surgical treatment. Procedure will be scheduled.  In the OR due to location.

## 2019-04-23 NOTE — H&P (View-Only) (Signed)
Subjective:   CC: Localized swelling, mass and lump, neck [R22.1]  HPI:  Lance Willis is a 54 y.o. male who was referred by Donnamarie Rossetti, * for evaluation of above. First noted 6 months ago.   asymptomatic, growing in size. Similar to cyst removed before.  Past Medical History:  has a past medical history of Hypertension, Osteoarthritis, and Sleep apnea.  Past Surgical History:  has a past surgical history that includes Appendectomy; Cholecystectomy; Tonsillectomy; Hernia repair; Endoscopic Carpal Tunnel Release (Left, 02/09/11); Colonoscopy (07/03/2014); and Knee arthroscopy with partial medial menisectomy right (Right, 06/28/2017).  Family History: family history includes High blood pressure (Hypertension) in his father, mother, sister, and sister; Lymphoma in his cousin.  Social History:  reports that he has never smoked. He has never used smokeless tobacco. He reports previous alcohol use. He reports that he does not use drugs.  Current Medications: has a current medication list which includes the following prescription(s): doxazosin, losartan-hydrochlorothiazide, and turmeric-turmeric root extract.  Allergies:  No Known Allergies  ROS:  A 15 point review of systems was performed and pertinent positives and negatives noted in HPI   Objective:   BP 145/87   Pulse 91   Ht 175.3 cm (5\' 9" )   Wt (!) 126.6 kg (279 lb)   BMI 41.20 kg/m   Constitutional :  alert, appears stated age, cooperative and no distress  Lymphatics/Throat:  no asymmetry, masses, or scars  Respiratory:  clear to auscultation bilaterally  Cardiovascular:  regular rate and rhythm  Gastrointestinal: soft, non-tender; bowel sounds normal; no masses,  no organomegaly.    Musculoskeletal: Steady gait and movement  Skin: Cool and moist.  Soft, smooth, 1.5cm mobile mass under skin around left occipital base, possible liopma, no other lumps or bumps in area.  Psychiatric: Normal affect,  non-agitated, not confused       LABS:  n/a   RADS: n/a  Assessment:      Localized swelling, mass and lump, neck [R22.1]  Plan:   1. Localized swelling, mass and lump, neck [R22.1] Discussed surgical excision.  Alternatives include continued observation.  Benefits include possible symptom relief, pathologic evaluation, improved cosmesis. Discussed the risk of surgery including recurrence, chronic pain, post-op infxn, poor cosmesis, poor/delayed wound healing, and possible re-operation to address said risks. The risks of general anesthetic, if used, includes MI, CVA, sudden death or even reaction to anesthetic medications also discussed.  Typical post-op recovery time of 3-5 days with possible activity restrictions were also discussed.  The patient verbalized understanding and all questions were answered to the patient's satisfaction.  2. Patient has elected to proceed with surgical treatment. Procedure will be scheduled.  In the OR due to location.

## 2019-05-05 ENCOUNTER — Other Ambulatory Visit: Payer: Self-pay

## 2019-05-05 ENCOUNTER — Encounter
Admission: RE | Admit: 2019-05-05 | Discharge: 2019-05-05 | Disposition: A | Discharge status: Home / Self Care | Payer: Federal, State, Local not specified - PPO | Source: Ambulatory Visit | Attending: Surgery | Admitting: Surgery

## 2019-05-05 DIAGNOSIS — Z01818 Encounter for other preprocedural examination: Secondary | ICD-10-CM | POA: Insufficient documentation

## 2019-05-05 HISTORY — DX: Sleep apnea, unspecified: G47.30

## 2019-05-05 NOTE — Patient Instructions (Signed)
Your procedure is scheduled on: 05-08-19 THURSDAY Report to Same Day Surgery 2nd floor medical mall [facility] Entrance-take elevator on left to 2nd floor.  Check in with surgery information desk.) To find out your arrival time please call [phone] between 1PM - 3PM on 05-07-19 [facility]  Remember: Instructions that are not followed completely may result in serious medical risk, up to and including death, or upon the discretion of your surgeon and anesthesiologist your surgery may need to be rescheduled.    _x___ 1. Do not eat food after midnight the night before your procedure. NO GUM OR CANDY AFTER MIDNIGHT. You may drink clear liquids up to 2 hours before you are scheduled to arrive at the hospital for your procedure.  Do not drink clear liquids within 2 hours of your scheduled arrival to the hospital.  Clear liquids include  --Water or Apple juice without pulp  --Gatorade  --Black Coffee or Clear Tea (No milk, no creamers, do not add anything to the coffee or Tea   ____Ensure clear carbohydrate drink on the way to the hospital for bariatric patients  ____Ensure clear carbohydrate drink 3 hours before surgery.    __x__ 2. No Alcohol for 24 hours before or after surgery.   __x__3. No Smoking or e-cigarettes for 24 prior to surgery.  Do not use any chewable tobacco products for at least 6 hour prior to surgery   ____  4. Bring all medications with you on the day of surgery if instructed.    __x__ 5. Notify your doctor if there is any change in your medical condition     (cold, fever, infections).    x___6. On the morning of surgery brush your teeth with toothpaste and water.  You may rinse your mouth with mouth wash if you wish.  Do not swallow any toothpaste or mouthwash.   Do not wear jewelry, make-up, hairpins, clips or nail polish.  Do not wear lotions, powders, or perfumes.  Do not shave 48 hours prior to surgery. Men may shave face and neck.  Do not bring valuables to  the hospital.    [facility] is not responsible for any belongings or valuables.               Contacts, dentures or bridgework may not be worn into surgery.  Leave your suitcase in the car. After surgery it may be brought to your room.  For patients admitted to the hospital, discharge time is determined by your treatment team.  _  Patients discharged the day of surgery will not be allowed to drive home.  You will need someone to drive you home and stay with you the night of your procedure.    Please read over the following fact sheets that you were given:   [facility] Preparing for Surgery and or MRSA Information   _x___ Take anti-hypertensive listed below, cardiac, seizure, asthma, anti-reflux and psychiatric medicines. These include:  1. NONE  2.  3.  4.  5.  6.  ____Fleets enema or Magnesium Citrate as directed.   _x___ Use CHG Soap or sage wipes as directed on instruction sheet   ____ Use inhalers on the day of surgery and bring to hospital day of surgery  ____ Stop Metformin and Janumet 2 days prior to surgery.    ____ Take 1/2 of usual insulin dose the night before surgery and none on the morning surgery.   ____ Follow recommendations from Cardiologist, Pulmonologist or PCP regarding stopping  Aspirin, Coumadin, Plavix ,Eliquis, Effient, or Pradaxa, and Pletal.  X____Stop Anti-inflammatories such as Advil, Aleve, Ibuprofen, Motrin, Naproxen, Naprosyn, Goodies powders or aspirin products NOW-OK to take Tylenol    ____ Stop supplements until after surgery.     ____ Bring C-Pap to the hospital.

## 2019-05-06 ENCOUNTER — Encounter
Admission: RE | Admit: 2019-05-06 | Discharge: 2019-05-06 | Disposition: A | Discharge status: Home / Self Care | Payer: Federal, State, Local not specified - PPO | Source: Ambulatory Visit | Attending: Surgery | Admitting: Surgery

## 2019-05-06 ENCOUNTER — Other Ambulatory Visit: Payer: Federal, State, Local not specified - PPO

## 2019-05-06 DIAGNOSIS — I10 Essential (primary) hypertension: Secondary | ICD-10-CM | POA: Diagnosis not present

## 2019-05-06 DIAGNOSIS — Z01812 Encounter for preprocedural laboratory examination: Secondary | ICD-10-CM | POA: Diagnosis not present

## 2019-05-06 DIAGNOSIS — Z20822 Contact with and (suspected) exposure to covid-19: Secondary | ICD-10-CM | POA: Diagnosis not present

## 2019-05-06 DIAGNOSIS — Z01810 Encounter for preprocedural cardiovascular examination: Secondary | ICD-10-CM | POA: Insufficient documentation

## 2019-05-06 LAB — SARS CORONAVIRUS 2 (TAT 6-24 HRS): SARS Coronavirus 2: NEGATIVE

## 2019-05-08 ENCOUNTER — Ambulatory Visit: Payer: Federal, State, Local not specified - PPO | Admitting: Anesthesiology

## 2019-05-08 ENCOUNTER — Encounter: Payer: Self-pay | Admitting: Surgery

## 2019-05-08 ENCOUNTER — Ambulatory Visit
Admission: RE | Admit: 2019-05-08 | Discharge: 2019-05-08 | Disposition: A | Discharge status: Home / Self Care | Payer: Federal, State, Local not specified - PPO | Source: Ambulatory Visit | Attending: Surgery | Admitting: Surgery

## 2019-05-08 ENCOUNTER — Other Ambulatory Visit: Payer: Self-pay

## 2019-05-08 ENCOUNTER — Encounter
Admission: RE | Disposition: A | Discharge status: Home / Self Care | Payer: Self-pay | Source: Ambulatory Visit | Attending: Surgery

## 2019-05-08 DIAGNOSIS — L720 Epidermal cyst: Secondary | ICD-10-CM | POA: Insufficient documentation

## 2019-05-08 DIAGNOSIS — I10 Essential (primary) hypertension: Secondary | ICD-10-CM | POA: Insufficient documentation

## 2019-05-08 DIAGNOSIS — G4730 Sleep apnea, unspecified: Secondary | ICD-10-CM | POA: Diagnosis not present

## 2019-05-08 DIAGNOSIS — Z79899 Other long term (current) drug therapy: Secondary | ICD-10-CM | POA: Insufficient documentation

## 2019-05-08 DIAGNOSIS — M1990 Unspecified osteoarthritis, unspecified site: Secondary | ICD-10-CM | POA: Diagnosis not present

## 2019-05-08 DIAGNOSIS — R221 Localized swelling, mass and lump, neck: Secondary | ICD-10-CM | POA: Diagnosis present

## 2019-05-08 DIAGNOSIS — D170 Benign lipomatous neoplasm of skin and subcutaneous tissue of head, face and neck: Secondary | ICD-10-CM | POA: Insufficient documentation

## 2019-05-08 DIAGNOSIS — Z6841 Body Mass Index (BMI) 40.0 and over, adult: Secondary | ICD-10-CM | POA: Insufficient documentation

## 2019-05-08 DIAGNOSIS — Z8249 Family history of ischemic heart disease and other diseases of the circulatory system: Secondary | ICD-10-CM | POA: Insufficient documentation

## 2019-05-08 HISTORY — PX: EXCISION MASS NECK: SHX6703

## 2019-05-08 SURGERY — EXCISION, MASS, NECK
Anesthesia: General | Site: Neck | Laterality: Bilateral

## 2019-05-08 MED ORDER — DOCUSATE SODIUM 100 MG PO CAPS: 100.0000 mg | ORAL_CAPSULE | Freq: Two times a day (BID) | ORAL | 0 refills | Status: AC | PRN

## 2019-05-08 MED ORDER — EPHEDRINE SULFATE 50 MG/ML IJ SOLN
INTRAMUSCULAR | Status: DC | PRN
  Administered 2019-05-08: 10 mg via INTRAVENOUS

## 2019-05-08 MED ORDER — FENTANYL CITRATE (PF) 100 MCG/2ML IJ SOLN: 25.0000 ug | INTRAMUSCULAR | Status: DC | PRN

## 2019-05-08 MED ORDER — BUPIVACAINE-EPINEPHRINE (PF) 0.5% -1:200000 IJ SOLN
INTRAMUSCULAR | Status: AC
  Filled 2019-05-08: qty 30

## 2019-05-08 MED ORDER — EPHEDRINE SULFATE 50 MG/ML IJ SOLN
INTRAMUSCULAR | Status: AC
  Filled 2019-05-08: qty 1

## 2019-05-08 MED ORDER — CHLORHEXIDINE GLUCONATE CLOTH 2 % EX PADS: 6.0000 | MEDICATED_PAD | Freq: Once | CUTANEOUS | Status: DC

## 2019-05-08 MED ORDER — MIDAZOLAM HCL 2 MG/2ML IJ SOLN
INTRAMUSCULAR | Status: DC | PRN
  Administered 2019-05-08: 2 mg via INTRAVENOUS

## 2019-05-08 MED ORDER — FENTANYL CITRATE (PF) 100 MCG/2ML IJ SOLN
INTRAMUSCULAR | Status: DC | PRN
  Administered 2019-05-08 (×2): 50 ug via INTRAVENOUS

## 2019-05-08 MED ORDER — LACTATED RINGERS IV SOLN: INTRAVENOUS | Status: DC

## 2019-05-08 MED ORDER — ROCURONIUM BROMIDE 50 MG/5ML IV SOLN
INTRAVENOUS | Status: AC
  Filled 2019-05-08: qty 1

## 2019-05-08 MED ORDER — MIDAZOLAM HCL 2 MG/2ML IJ SOLN
INTRAMUSCULAR | Status: AC
  Filled 2019-05-08: qty 2

## 2019-05-08 MED ORDER — IBUPROFEN 800 MG PO TABS: 800.0000 mg | ORAL_TABLET | Freq: Three times a day (TID) | ORAL | 0 refills | Status: AC | PRN

## 2019-05-08 MED ORDER — PROPOFOL 10 MG/ML IV BOLUS
INTRAVENOUS | Status: AC
  Filled 2019-05-08: qty 40

## 2019-05-08 MED ORDER — LIDOCAINE HCL (PF) 1 % IJ SOLN
INTRAMUSCULAR | Status: AC
  Filled 2019-05-08: qty 30

## 2019-05-08 MED ORDER — SUCCINYLCHOLINE CHLORIDE 20 MG/ML IJ SOLN
INTRAMUSCULAR | Status: DC | PRN
  Administered 2019-05-08: 140 mg via INTRAVENOUS

## 2019-05-08 MED ORDER — PROPOFOL 10 MG/ML IV BOLUS
INTRAVENOUS | Status: DC | PRN
  Administered 2019-05-08: 200 mg via INTRAVENOUS

## 2019-05-08 MED ORDER — PHENYLEPHRINE HCL (PRESSORS) 10 MG/ML IV SOLN
INTRAVENOUS | Status: AC
  Filled 2019-05-08: qty 1

## 2019-05-08 MED ORDER — ONDANSETRON HCL 4 MG/2ML IJ SOLN: 4.0000 mg | Freq: Once | INTRAMUSCULAR | Status: DC | PRN

## 2019-05-08 MED ORDER — LIDOCAINE HCL (CARDIAC) PF 100 MG/5ML IV SOSY
PREFILLED_SYRINGE | INTRAVENOUS | Status: DC | PRN
  Administered 2019-05-08: 80 mg via INTRAVENOUS

## 2019-05-08 MED ORDER — CEFAZOLIN SODIUM-DEXTROSE 2-4 GM/100ML-% IV SOLN
2.0000 g | INTRAVENOUS | Status: AC
  Administered 2019-05-08: 2 g via INTRAVENOUS

## 2019-05-08 MED ORDER — CEFAZOLIN SODIUM-DEXTROSE 2-4 GM/100ML-% IV SOLN
INTRAVENOUS | Status: AC
  Filled 2019-05-08: qty 100

## 2019-05-08 MED ORDER — ROCURONIUM BROMIDE 100 MG/10ML IV SOLN
INTRAVENOUS | Status: DC | PRN
  Administered 2019-05-08: 10 mg via INTRAVENOUS
  Administered 2019-05-08: 40 mg via INTRAVENOUS
  Administered 2019-05-08: 10 mg via INTRAVENOUS

## 2019-05-08 MED ORDER — SUGAMMADEX SODIUM 500 MG/5ML IV SOLN
INTRAVENOUS | Status: DC | PRN
  Administered 2019-05-08: 400 mg via INTRAVENOUS

## 2019-05-08 MED ORDER — FAMOTIDINE 20 MG PO TABS: 20.0000 mg | ORAL_TABLET | Freq: Once | ORAL | Status: AC

## 2019-05-08 MED ORDER — PHENYLEPHRINE HCL (PRESSORS) 10 MG/ML IV SOLN
INTRAVENOUS | Status: DC | PRN
  Administered 2019-05-08: 100 ug via INTRAVENOUS

## 2019-05-08 MED ORDER — LIDOCAINE HCL 1 % IJ SOLN
INTRAMUSCULAR | Status: DC | PRN
  Administered 2019-05-08: 1 mL

## 2019-05-08 MED ORDER — ACETAMINOPHEN 325 MG PO TABS: 650.0000 mg | ORAL_TABLET | Freq: Three times a day (TID) | ORAL | 0 refills | Status: AC | PRN

## 2019-05-08 MED ORDER — BUPIVACAINE-EPINEPHRINE 0.5% -1:200000 IJ SOLN
INTRAMUSCULAR | Status: DC | PRN
  Administered 2019-05-08: 1 mL

## 2019-05-08 MED ORDER — FENTANYL CITRATE (PF) 100 MCG/2ML IJ SOLN
INTRAMUSCULAR | Status: AC
  Filled 2019-05-08: qty 2

## 2019-05-08 MED ORDER — HYDROCODONE-ACETAMINOPHEN 5-325 MG PO TABS: 1.0000 | ORAL_TABLET | Freq: Four times a day (QID) | ORAL | 0 refills | Status: AC | PRN

## 2019-05-08 MED ORDER — FAMOTIDINE 20 MG PO TABS
ORAL_TABLET | ORAL | Status: AC
  Administered 2019-05-08: 07:00:00 20 mg via ORAL
  Filled 2019-05-08: qty 1

## 2019-05-08 SURGICAL SUPPLY — 31 items
BLADE SURG 15 STRL LF DISP TIS (BLADE) ×1 IMPLANT
BLADE SURG 15 STRL SS (BLADE) ×2
CHLORAPREP W/TINT 26 (MISCELLANEOUS) ×3 IMPLANT
COVER WAND RF STERILE (DRAPES) ×3 IMPLANT
DERMABOND ADVANCED (GAUZE/BANDAGES/DRESSINGS) ×4
DERMABOND ADVANCED .7 DNX12 (GAUZE/BANDAGES/DRESSINGS) ×1 IMPLANT
DRAPE 3/4 80X56 (DRAPES) ×3 IMPLANT
DRAPE LAPAROTOMY 100X77 ABD (DRAPES) ×3 IMPLANT
ELECT CAUTERY BLADE 6.4 (BLADE) ×3 IMPLANT
ELECT REM PT RETURN 9FT ADLT (ELECTROSURGICAL) ×3
ELECTRODE REM PT RTRN 9FT ADLT (ELECTROSURGICAL) ×1 IMPLANT
GLOVE BIOGEL PI IND STRL 7.0 (GLOVE) ×1 IMPLANT
GLOVE BIOGEL PI INDICATOR 7.0 (GLOVE) ×2
GLOVE SURG SYN 6.5 ES PF (GLOVE) ×3 IMPLANT
GLOVE SURG SYN 6.5 PF PI (GLOVE) ×1 IMPLANT
GOWN STRL REUS W/ TWL LRG LVL3 (GOWN DISPOSABLE) ×2 IMPLANT
GOWN STRL REUS W/TWL LRG LVL3 (GOWN DISPOSABLE) ×4
KIT TURNOVER KIT A (KITS) ×3 IMPLANT
LABEL OR SOLS (LABEL) ×3 IMPLANT
NEEDLE HYPO 22GX1.5 SAFETY (NEEDLE) ×3 IMPLANT
NS IRRIG 1000ML POUR BTL (IV SOLUTION) ×3 IMPLANT
PACK BASIN MINOR ARMC (MISCELLANEOUS) ×3 IMPLANT
SUT ETHILON 3-0 FS-10 30 BLK (SUTURE)
SUT MNCRL 4-0 (SUTURE) ×2
SUT MNCRL 4-0 27XMFL (SUTURE) ×1
SUT VIC AB 3-0 SH 27 (SUTURE) ×2
SUT VIC AB 3-0 SH 27X BRD (SUTURE) ×1 IMPLANT
SUTURE EHLN 3-0 FS-10 30 BLK (SUTURE) IMPLANT
SUTURE MNCRL 4-0 27XMF (SUTURE) ×1 IMPLANT
SYR 30ML LL (SYRINGE) ×3 IMPLANT
TOWEL OR 17X26 4PK STRL BLUE (TOWEL DISPOSABLE) ×3 IMPLANT

## 2019-05-08 NOTE — Anesthesia Preprocedure Evaluation (Signed)
Anesthesia Evaluation  Patient identified by MRN, date of birth, ID band Patient awake    Reviewed: Allergy & Precautions, NPO status , Patient's Chart, lab work & pertinent test results  Airway Mallampati: II  TM Distance: >3 FB     Dental  (+) Teeth Intact, Chipped   Pulmonary sleep apnea ,    Pulmonary exam normal        Cardiovascular hypertension, Normal cardiovascular exam     Neuro/Psych negative neurological ROS  negative psych ROS   GI/Hepatic negative GI ROS, Neg liver ROS,   Endo/Other  Morbid obesity  Renal/GU negative Renal ROS  negative genitourinary   Musculoskeletal  (+) Arthritis , Osteoarthritis,    Abdominal Normal abdominal exam  (+)   Peds negative pediatric ROS (+)  Hematology negative hematology ROS (+)   Anesthesia Other Findings Past Medical History: No date: Arthritis No date: Hypertension No date: Sleep apnea     Comment:  USES CPAP  Reproductive/Obstetrics                             Anesthesia Physical Anesthesia Plan  ASA: III  Anesthesia Plan: General   Post-op Pain Management:    Induction: Intravenous  PONV Risk Score and Plan:   Airway Management Planned: Oral ETT  Additional Equipment:   Intra-op Plan:   Post-operative Plan: Extubation in OR  Informed Consent: I have reviewed the patients History and Physical, chart, labs and discussed the procedure including the risks, benefits and alternatives for the proposed anesthesia with the patient or authorized representative who has indicated his/her understanding and acceptance.     Dental advisory given  Plan Discussed with: CRNA and Surgeon  Anesthesia Plan Comments:         Anesthesia Quick Evaluation

## 2019-05-08 NOTE — Discharge Instructions (Signed)
AMBULATORY SURGERY  DISCHARGE INSTRUCTIONS   1) The drugs that you were given will stay in your system until tomorrow so for the next 24 hours you should not:  A) Drive an automobile B) Make any legal decisions C) Drink any alcoholic beverage   2) You may resume regular meals tomorrow.  Today it is better to start with liquids and gradually work up to solid foods.  You may eat anything you prefer, but it is better to start with liquids, then soup and crackers, and gradually work up to solid foods.   3) Please notify your doctor immediately if you have any unusual bleeding, trouble breathing, redness and pain at the surgery site, drainage, fever, or pain not relieved by medication. 4)   5) Your post-operative visit with Dr.                                     is: Date:                        Time:    Please call to schedule your post-operative visit.  6) Additional Instructions:    Removal, Care After This sheet gives you information about how to care for yourself after your procedure. Your health care provider may also give you more specific instructions. If you have problems or questions, contact your health care provider. What can I expect after the procedure? After the procedure, it is common to have:  Soreness.  Bruising.  Itching. Follow these instructions at home: site care Follow instructions from your health care provider about how to take care of your site. Make sure you:  Wash your hands with soap and water before and after you change your bandage (dressing). If soap and water are not available, use hand sanitizer.  Leave stitches (sutures), skin glue, or adhesive strips in place. These skin closures may need to stay in place for 2 weeks or longer. If adhesive strip edges start to loosen and curl up, you may trim the loose edges. Do not remove adhesive strips completely unless your health care provider tells you to do that.  If the area bleeds or bruises,  apply gentle pressure for 10 minutes.  OK TO SHOWER IN 24HRS  Check your site every day for signs of infection. Check for:  Redness, swelling, or pain.  Fluid or blood.  Warmth.  Pus or a bad smell.  General instructions  Rest and then return to your normal activities as told by your health care provider. .  tylenol and advil as needed for discomfort.  Please alternate between the two every four hours as needed for pain.   .  Use narcotics, if prescribed, only when tylenol and motrin is not enough to control pain. .  325-650mg every 8hrs to max of 3000mg/24hrs (including the 325mg in every norco dose) for the tylenol.   .  Advil up to 800mg per dose every 8hrs as needed for pain.    Keep all follow-up visits as told by your health care provider. This is important. Contact a health care provider if:  You have redness, swelling, or pain around your site.  You have fluid or blood coming from your site.  Your site feels warm to the touch.  You have pus or a bad smell coming from your site.  You have a fever.  Your sutures, skin glue,   or adhesive strips loosen or come off sooner than expected. Get help right away if:  You have bleeding that does not stop with pressure or a dressing. Summary  After the procedure, it is common to have some soreness, bruising, and itching at the site.  Follow instructions from your health care provider about how to take care of your site.  Check your site every day for signs of infection.  Contact a health care provider if you have redness, swelling, or pain around your site, or your site feels warm to the touch.  Keep all follow-up visits as told by your health care provider. This is important. This information is not intended to replace advice given to you by your health care provider. Make sure you discuss any questions you have with your health care provider. Document Released: 03/26/2015 Document Revised: 08/27/2017 Document Reviewed:  08/27/2017 Elsevier Interactive Patient Education  2019 [employer]    

## 2019-05-08 NOTE — Op Note (Signed)
Pre-Op Dx: Neck mass x2 Post-Op Dx: Same Anesthesia: General EBL: 10 mL Complications:  none apparent Specimen: Right and left neck mass Procedure: excisional biopsy right and left neck mass Surgeon: Lysle Pearl  Indications for procedure: See H&P.  Description of Procedure:  Consent obtained, time out performed.  Patient placed in prone position.  Area sterilized and draped in usual position.  Local infused to area previously marked on left side.  4 cm incision made through dermis with 15blade and lipoma-like lesion noted in subcutaneous layer.  The 2 cm x 1.5 cm x 1.5 cm lesion then removed from surrounding tissue completely using electrocautery, passed off field pending pathology.  Wound hemostasis noted, then closed in two layer fashion with 4-0 vicryl in interrupted fashion for deep dermal layer, then running 4-0 monocryl in subcuticular fashion for epidermal layer.  Wound then dressed with dermabond.     Local infused to area previously marked on right side.  4 cm incision made through dermis with 15blade and EIC-like lesion noted in subcutaneous layer.  The  2 cm x 1.5 cm x 1.5 cm lesion then removed from surrounding tissue completely using electrocautery, passed off field pending pathology.  Wound hemostasis noted, then closed in two layer fashion with 4-0 vicryl in interrupted fashion for deep dermal layer, then running 4-0 monocryl in subcuticular fashion for epidermal layer.  Wound then dressed with dermabond.    Pt tolerated procedure well, and transferred to PACU in stable condition. Sponge and instrument count correct at end of procedure.

## 2019-05-08 NOTE — Anesthesia Procedure Notes (Signed)
Procedure Name: Intubation Date/Time: 05/08/2019 7:40 AM Performed by: Allean Found, CRNA Pre-anesthesia Checklist: Patient identified, Patient being monitored, Timeout performed, Emergency Drugs available and Suction available Patient Re-evaluated:Patient Re-evaluated prior to induction Oxygen Delivery Method: Circle system utilized Preoxygenation: Pre-oxygenation with 100% oxygen Induction Type: IV induction Ventilation: Mask ventilation without difficulty Laryngoscope Size: McGraph and 4 Grade View: Grade I Tube type: Oral Tube size: 7.5 mm Number of attempts: 1 Airway Equipment and Method: Stylet Placement Confirmation: ETT inserted through vocal cords under direct vision,  positive ETCO2 and breath sounds checked- equal and bilateral Secured at: 23 cm Tube secured with: Tape Dental Injury: Teeth and Oropharynx as per pre-operative assessment

## 2019-05-08 NOTE — Anesthesia Postprocedure Evaluation (Signed)
Anesthesia Post Note  Patient: Lance Willis  Procedure(s) Performed: EXCISION MASS NECK left and right (Bilateral Neck)  Patient location during evaluation: PACU Anesthesia Type: General Level of consciousness: awake and alert and oriented Pain management: pain level controlled Vital Signs Assessment: post-procedure vital signs reviewed and stable Respiratory status: spontaneous breathing Cardiovascular status: blood pressure returned to baseline Anesthetic complications: no     Last Vitals:  Vitals:   05/08/19 0946 05/08/19 0955  BP: 137/90 (!) 141/78  Pulse: 85 76  Resp: 17 18  Temp:  (!) 36.4 C  SpO2: 100% 99%    Last Pain:  Vitals:   05/08/19 0955  TempSrc: Temporal  PainSc: 0-No pain                 Arna Luis

## 2019-05-08 NOTE — Interval H&P Note (Signed)
History and Physical Interval Note:  05/08/2019 7:24 AM  Lance Willis  has presented today for surgery, with the diagnosis of R22.1 Localized swelling, mass & lump, neck.  The various methods of treatment have been discussed with the patient and family. After consideration of risks, benefits and other options for treatment, the patient has consented to  Procedure(s): EXCISION MASS NECK (N/A) as a surgical intervention.  The patient's history has been reviewed, patient examined, stable for surgery. Noticed another one in the neck, requesting removal.  Will proceed with excision of both lesions. I have reviewed the patient's chart and labs.  Questions were answered to the patient's satisfaction.     [name] Lysle Pearl

## 2019-05-08 NOTE — Transfer of Care (Signed)
Immediate Anesthesia Transfer of Care Note  Patient: Lance Willis  Procedure(s) Performed: EXCISION MASS NECK left and right (Bilateral Neck)  Patient Location: PACU  Anesthesia Type:General  Level of Consciousness: sedated and unresponsive  Airway & Oxygen Therapy: Patient Spontanous Breathing and Patient connected to face mask oxygen  Post-op Assessment: Report given to RN and Post -op Vital signs reviewed and stable  Post vital signs: Reviewed and stable  Last Vitals:  Vitals Value Taken Time  BP 140/93 05/08/19 0913  Temp    Pulse 78 05/08/19 0916  Resp 20 05/08/19 0916  SpO2 100 % 05/08/19 0916  Vitals shown include unvalidated device data.  Last Pain:  Vitals:   05/08/19 0620  TempSrc: Tympanic  PainSc: 0-No pain         Complications: No apparent anesthesia complications

## 2019-05-09 LAB — SURGICAL PATHOLOGY

## 2019-05-09 NOTE — Progress Notes (Signed)
Left message on voicemail.

## 2019-12-29 ENCOUNTER — Ambulatory Visit: Payer: Federal, State, Local not specified - PPO | Attending: Neurology

## 2019-12-29 DIAGNOSIS — G4733 Obstructive sleep apnea (adult) (pediatric): Secondary | ICD-10-CM | POA: Diagnosis not present

## 2019-12-29 DIAGNOSIS — Z9989 Dependence on other enabling machines and devices: Secondary | ICD-10-CM | POA: Diagnosis not present

## 2019-12-30 ENCOUNTER — Other Ambulatory Visit: Payer: Self-pay
